# Patient Record
Sex: Female | Born: 1955 | Race: White | Hispanic: No | Marital: Married | Smoking: Never smoker
Health system: Southern US, Community
[De-identification: ages and names within clinical notes are randomized; demographics above are authoritative.]

## PROBLEM LIST (undated history)

## (undated) DIAGNOSIS — D649 Anemia, unspecified: Secondary | ICD-10-CM

## (undated) DIAGNOSIS — Z8719 Personal history of other diseases of the digestive system: Secondary | ICD-10-CM

## (undated) DIAGNOSIS — K219 Gastro-esophageal reflux disease without esophagitis: Secondary | ICD-10-CM

## (undated) DIAGNOSIS — T8859XA Other complications of anesthesia, initial encounter: Secondary | ICD-10-CM

## (undated) DIAGNOSIS — I480 Paroxysmal atrial fibrillation: Secondary | ICD-10-CM

## (undated) DIAGNOSIS — M419 Scoliosis, unspecified: Secondary | ICD-10-CM

## (undated) DIAGNOSIS — I10 Essential (primary) hypertension: Secondary | ICD-10-CM

## (undated) DIAGNOSIS — Z8489 Family history of other specified conditions: Secondary | ICD-10-CM

## (undated) DIAGNOSIS — Z87442 Personal history of urinary calculi: Secondary | ICD-10-CM

## (undated) DIAGNOSIS — R112 Nausea with vomiting, unspecified: Secondary | ICD-10-CM

## (undated) DIAGNOSIS — I421 Obstructive hypertrophic cardiomyopathy: Secondary | ICD-10-CM

## (undated) DIAGNOSIS — Z9889 Other specified postprocedural states: Secondary | ICD-10-CM

## (undated) HISTORY — PX: COLONOSCOPY: SHX174

## (undated) HISTORY — PX: LITHOTRIPSY: SUR834

---

## 1988-05-15 HISTORY — PX: DIAGNOSTIC LAPAROSCOPY: SUR761

## 2004-11-11 ENCOUNTER — Ambulatory Visit: Payer: Self-pay | Admitting: Gastroenterology

## 2004-12-09 ENCOUNTER — Ambulatory Visit: Payer: Self-pay | Admitting: Internal Medicine

## 2005-12-12 ENCOUNTER — Ambulatory Visit: Payer: Self-pay | Admitting: Internal Medicine

## 2009-09-16 ENCOUNTER — Ambulatory Visit: Payer: Self-pay | Admitting: Internal Medicine

## 2009-09-17 ENCOUNTER — Ambulatory Visit: Payer: Self-pay | Admitting: Internal Medicine

## 2009-09-23 ENCOUNTER — Ambulatory Visit: Payer: Self-pay | Admitting: Urology

## 2010-05-15 DIAGNOSIS — R42 Dizziness and giddiness: Secondary | ICD-10-CM

## 2010-05-15 HISTORY — DX: Dizziness and giddiness: R42

## 2011-09-12 ENCOUNTER — Ambulatory Visit: Payer: Self-pay | Admitting: Internal Medicine

## 2012-11-11 ENCOUNTER — Ambulatory Visit: Payer: Self-pay | Admitting: Internal Medicine

## 2013-11-12 ENCOUNTER — Ambulatory Visit: Payer: Self-pay | Admitting: Internal Medicine

## 2014-11-17 ENCOUNTER — Other Ambulatory Visit: Payer: Self-pay | Admitting: Internal Medicine

## 2014-11-17 DIAGNOSIS — Z1231 Encounter for screening mammogram for malignant neoplasm of breast: Secondary | ICD-10-CM

## 2014-11-18 ENCOUNTER — Ambulatory Visit
Admission: RE | Admit: 2014-11-18 | Discharge: 2014-11-18 | Disposition: A | Discharge status: Home / Self Care | Payer: BC Managed Care – PPO | Source: Ambulatory Visit | Attending: Internal Medicine | Admitting: Internal Medicine

## 2014-11-18 DIAGNOSIS — Z1231 Encounter for screening mammogram for malignant neoplasm of breast: Secondary | ICD-10-CM | POA: Diagnosis not present

## 2015-05-16 DIAGNOSIS — I5032 Chronic diastolic (congestive) heart failure: Secondary | ICD-10-CM | POA: Diagnosis present

## 2015-05-16 HISTORY — DX: Chronic diastolic (congestive) heart failure: I50.32

## 2015-08-18 ENCOUNTER — Emergency Department: Payer: BC Managed Care – PPO

## 2015-08-18 ENCOUNTER — Observation Stay
Admission: EM | Admit: 2015-08-18 | Discharge: 2015-08-19 | Disposition: A | Discharge status: Home / Self Care | Payer: BC Managed Care – PPO | Attending: Internal Medicine | Admitting: Internal Medicine

## 2015-08-18 ENCOUNTER — Observation Stay (HOSPITAL_BASED_OUTPATIENT_CLINIC_OR_DEPARTMENT_OTHER)
Admit: 2015-08-18 | Discharge: 2015-08-18 | Disposition: A | Discharge status: Home / Self Care | Payer: BC Managed Care – PPO | Attending: Internal Medicine | Admitting: Internal Medicine

## 2015-08-18 ENCOUNTER — Encounter: Payer: Self-pay | Admitting: Emergency Medicine

## 2015-08-18 DIAGNOSIS — I209 Angina pectoris, unspecified: Secondary | ICD-10-CM | POA: Diagnosis not present

## 2015-08-18 DIAGNOSIS — Z6834 Body mass index (BMI) 34.0-34.9, adult: Secondary | ICD-10-CM | POA: Diagnosis not present

## 2015-08-18 DIAGNOSIS — R079 Chest pain, unspecified: Secondary | ICD-10-CM

## 2015-08-18 DIAGNOSIS — I248 Other forms of acute ischemic heart disease: Secondary | ICD-10-CM | POA: Diagnosis not present

## 2015-08-18 DIAGNOSIS — M419 Scoliosis, unspecified: Secondary | ICD-10-CM | POA: Diagnosis not present

## 2015-08-18 DIAGNOSIS — R7989 Other specified abnormal findings of blood chemistry: Secondary | ICD-10-CM

## 2015-08-18 DIAGNOSIS — E669 Obesity, unspecified: Secondary | ICD-10-CM | POA: Diagnosis not present

## 2015-08-18 DIAGNOSIS — J329 Chronic sinusitis, unspecified: Secondary | ICD-10-CM | POA: Diagnosis not present

## 2015-08-18 DIAGNOSIS — Z8379 Family history of other diseases of the digestive system: Secondary | ICD-10-CM | POA: Diagnosis not present

## 2015-08-18 DIAGNOSIS — R778 Other specified abnormalities of plasma proteins: Secondary | ICD-10-CM

## 2015-08-18 DIAGNOSIS — I10 Essential (primary) hypertension: Secondary | ICD-10-CM | POA: Diagnosis not present

## 2015-08-18 DIAGNOSIS — Z833 Family history of diabetes mellitus: Secondary | ICD-10-CM | POA: Diagnosis not present

## 2015-08-18 DIAGNOSIS — I422 Other hypertrophic cardiomyopathy: Secondary | ICD-10-CM | POA: Diagnosis not present

## 2015-08-18 DIAGNOSIS — E785 Hyperlipidemia, unspecified: Secondary | ICD-10-CM | POA: Diagnosis not present

## 2015-08-18 DIAGNOSIS — I208 Other forms of angina pectoris: Secondary | ICD-10-CM

## 2015-08-18 DIAGNOSIS — R42 Dizziness and giddiness: Secondary | ICD-10-CM | POA: Insufficient documentation

## 2015-08-18 DIAGNOSIS — I2089 Other forms of angina pectoris: Secondary | ICD-10-CM

## 2015-08-18 HISTORY — DX: Essential (primary) hypertension: I10

## 2015-08-18 LAB — CBC
HEMATOCRIT: 39.9 % (ref 35.0–47.0)
Hemoglobin: 13.4 g/dL (ref 12.0–16.0)
MCH: 28.6 pg (ref 26.0–34.0)
MCHC: 33.7 g/dL (ref 32.0–36.0)
MCV: 84.8 fL (ref 80.0–100.0)
PLATELETS: 375 10*3/uL (ref 150–440)
RBC: 4.7 MIL/uL (ref 3.80–5.20)
RDW: 13.9 % (ref 11.5–14.5)
WBC: 11.4 10*3/uL — AB (ref 3.6–11.0)

## 2015-08-18 LAB — TROPONIN I
TROPONIN I: 0.05 ng/mL — AB (ref <0.031)
Troponin I: <0.03 ng/mL (ref <0.031)
Troponin I: <0.03 ng/mL (ref <0.031)

## 2015-08-18 LAB — LIPID PANEL
CHOL/HDL RATIO: 3.3 ratio
CHOLESTEROL: 226 mg/dL — AB (ref 0–200)
HDL: 69 mg/dL (ref >40)
LDL Cholesterol: 143 mg/dL — ABNORMAL HIGH (ref 0–99)
Triglycerides: 68 mg/dL (ref <150)
VLDL: 14 mg/dL (ref 0–40)

## 2015-08-18 LAB — BASIC METABOLIC PANEL
Anion gap: 7 (ref 5–15)
BUN: 19 mg/dL (ref 6–20)
CO2: 25 mmol/L (ref 22–32)
CREATININE: 0.66 mg/dL (ref 0.44–1.00)
Calcium: 9 mg/dL (ref 8.9–10.3)
Chloride: 106 mmol/L (ref 101–111)
Glucose, Bld: 109 mg/dL — ABNORMAL HIGH (ref 65–99)
POTASSIUM: 3.9 mmol/L (ref 3.5–5.1)
Sodium: 138 mmol/L (ref 135–145)

## 2015-08-18 LAB — ECHOCARDIOGRAM COMPLETE
Height: 68 in
WEIGHTICAEL: 3600 [oz_av]

## 2015-08-18 MED ORDER — NITROGLYCERIN 2 % TD OINT
1.0000 [in_us] | TOPICAL_OINTMENT | Freq: Once | TRANSDERMAL | Status: AC
Start: 2015-08-18 — End: 2015-08-18
  Administered 2015-08-18: 1 [in_us] via TOPICAL
  Filled 2015-08-18: qty 1

## 2015-08-18 MED ORDER — NITROGLYCERIN 0.4 MG SL SUBL: 0.4000 mg | SUBLINGUAL_TABLET | SUBLINGUAL | Status: DC | PRN

## 2015-08-18 MED ORDER — AMOXICILLIN 250 MG/5ML PO SUSR
875.0000 mg | Freq: Two times a day (BID) | ORAL | Status: DC
  Administered 2015-08-18 – 2015-08-19 (×2): 875 mg via ORAL
  Filled 2015-08-18 (×5): qty 20

## 2015-08-18 MED ORDER — PREDNISONE 10 MG PO TABS
5.0000 mg | ORAL_TABLET | ORAL | Status: DC
  Administered 2015-08-18: 5 mg via ORAL
  Filled 2015-08-18 (×2): qty 1

## 2015-08-18 MED ORDER — ACETAMINOPHEN 325 MG PO TABS
650.0000 mg | ORAL_TABLET | Freq: Once | ORAL | Status: AC
  Administered 2015-08-18: 650 mg via ORAL
  Filled 2015-08-18: qty 2

## 2015-08-18 MED ORDER — CALCIUM CARBONATE-VITAMIN D 500-200 MG-UNIT PO TABS
1.0000 | ORAL_TABLET | Freq: Every day | ORAL | Status: DC
  Administered 2015-08-19: 1 via ORAL
  Filled 2015-08-18: qty 1

## 2015-08-18 MED ORDER — AMOXICILLIN 875 MG PO TABS
875.0000 mg | ORAL_TABLET | Freq: Two times a day (BID) | ORAL | Status: DC
  Filled 2015-08-18 (×3): qty 1

## 2015-08-18 MED ORDER — HEPARIN SODIUM (PORCINE) 5000 UNIT/ML IJ SOLN
5000.0000 [IU] | Freq: Three times a day (TID) | INTRAMUSCULAR | Status: DC
  Administered 2015-08-18 – 2015-08-19 (×2): 5000 [IU] via SUBCUTANEOUS
  Filled 2015-08-18 (×2): qty 1

## 2015-08-18 MED ORDER — ASPIRIN 81 MG PO CHEW
324.0000 mg | CHEWABLE_TABLET | Freq: Once | ORAL | Status: DC
  Filled 2015-08-18: qty 4

## 2015-08-18 MED ORDER — ALENDRONATE SODIUM 70 MG PO TABS: 70.0000 mg | ORAL_TABLET | ORAL | Status: DC

## 2015-08-18 MED ORDER — ASPIRIN EC 325 MG PO TBEC
325.0000 mg | DELAYED_RELEASE_TABLET | Freq: Every day | ORAL | Status: DC
  Administered 2015-08-19: 325 mg via ORAL
  Filled 2015-08-18: qty 1

## 2015-08-18 MED ORDER — ENOXAPARIN SODIUM 100 MG/ML ~~LOC~~ SOLN
1.0000 mg/kg | Freq: Once | SUBCUTANEOUS | Status: AC
  Administered 2015-08-18: 100 mg via SUBCUTANEOUS
  Filled 2015-08-18: qty 1

## 2015-08-18 NOTE — Progress Notes (Signed)

## 2015-08-18 NOTE — H&P (Signed)
[facility] - [place] at [facility]   PATIENT NAME: Stephanie GrayerDeborah Helton    MR#:  [id number]  DATE OF BIRTH:  03/30/1956  DATE OF ADMISSION:  08/18/2015  PRIMARY CARE PHYSICIAN: Lynnea FerrierBERT J KLEIN III, MD   REQUESTING/REFERRING PHYSICIAN: Chrissie NoaWilliam  CHIEF COMPLAINT:   Chief Complaint  Patient presents with  . Chest Pain    "pressure to chest"    HISTORY OF PRESENT ILLNESS: Stephanie Cooper  is a 60 y.o. female with a known history of Hypertension- presents to emergency room today- after having pressure-like retrosternal chest pain for last night. The pain could not let her sleep overnight which was on and off and there were no aggravating or relieving factors. There was no associated palpitation or shortness of breath. Her husband checked her blood pressure and the systolic was more than 200.Marland Kitchen. She took 2 tablets of 325 mg aspirin at home and her husband drove her to emergency room. While she came to emergency room her pain started subsiding and it was completely gone since 7:00 in the morning. She denies any recent similar pain in the past.  For last 1 week she had complain of heaviness in her sinus and some ringing in the ER, so she saw her primary care doctor a few days ago and she was given amoxicillin tablet and prednisone which she is taking for last 3-4 days.  Her troponin was followed in ER and there was slight rise in the second troponin so she is given his admission for observation.  PAST MEDICAL HISTORY:   Past Medical History  Diagnosis Date  . Hypertension     PAST SURGICAL HISTORY: History reviewed. No pertinent past surgical history.  SOCIAL HISTORY:  Social History  Substance Use Topics  . Smoking status: Never Smoker   . Smokeless tobacco: Not on file  . Alcohol Use: No    FAMILY HISTORY:  Family History  Problem Relation Age of Onset  . Diverticulitis Mother   . Diabetes Father     DRUG ALLERGIES: No Known Allergies  REVIEW OF SYSTEMS:    CONSTITUTIONAL: No fever, fatigue or weakness.  EYES: No blurred or double vision.  EARS, NOSE, AND THROAT: No tinnitus or ear pain.  RESPIRATORY: No cough, shortness of breath, wheezing or hemoptysis.  CARDIOVASCULAR:positive chest pain, no orthopnea, edema.  GASTROINTESTINAL: No nausea, vomiting, diarrhea or abdominal pain.  GENITOURINARY: No dysuria, hematuria.  ENDOCRINE: No polyuria, nocturia,  HEMATOLOGY: No anemia, easy bruising or bleeding SKIN: No rash or lesion. MUSCULOSKELETAL: No joint pain or arthritis.   NEUROLOGIC: No tingling, numbness, weakness.  PSYCHIATRY: No anxiety or depression.   MEDICATIONS AT HOME:  Prior to Admission medications   Medication Sig Start Date End Date Taking? Authorizing Provider  alendronate (FOSAMAX) 70 MG tablet Take 70 mg by mouth once a week. Take with a full glass of water on an empty stomach.   Yes Historical Provider, MD  amoxicillin (AMOXIL) 875 MG tablet Take 875 mg by mouth 2 (two) times daily. 08/14/15 08/24/15 Yes Historical Provider, MD  calcium-vitamin D (OSCAL WITH D) 500-200 MG-UNIT tablet Take 1 tablet by mouth daily with breakfast.   Yes Historical Provider, MD  predniSONE (DELTASONE) 5 MG tablet Take 5 mg by mouth as directed. Taper dose 6-5-4-3-2-1 08/14/15 08/20/15 Yes Historical Provider, MD      PHYSICAL EXAMINATION:   VITAL SIGNS: Blood pressure 136/74, pulse 66, temperature 98.2 F (36.8 C), temperature source Oral, resp. rate 17, height 5\' 8"  (1.727 m),  weight 102.059 kg (225 lb), SpO2 99 %.  GENERAL:  60 y.o.-year-old patient lying in the bed with no acute distress.  EYES: Pupils equal, round, reactive to light and accommodation. No scleral icterus. Extraocular muscles intact.  HEENT: Head atraumatic, normocephalic. Oropharynx and nasopharynx clear.  NECK:  Supple, no jugular venous distention. No thyroid enlargement, no tenderness.  LUNGS: Normal breath sounds bilaterally, no wheezing, rales,rhonchi or crepitation. No  use of accessory muscles of respiration.  CARDIOVASCULAR: S1, S2 normal. No murmurs, rubs, or gallops.  ABDOMEN: Soft, nontender, nondistended. Bowel sounds present. No organomegaly or mass.  EXTREMITIES: No pedal edema, cyanosis, or clubbing.  NEUROLOGIC: Cranial nerves II through XII are intact. Muscle strength 5/5 in all extremities. Sensation intact. Gait not checked.  PSYCHIATRIC: The patient is alert and oriented x 3.  SKIN: No obvious rash, lesion, or ulcer.   LABORATORY PANEL:   CBC  Recent Labs Lab 08/18/15 0515  WBC 11.4*  HGB 13.4  HCT 39.9  PLT 375  MCV 84.8  MCH 28.6  MCHC 33.7  RDW 13.9   ------------------------------------------------------------------------------------------------------------------  Chemistries   Recent Labs Lab 08/18/15 0515  NA 138  K 3.9  CL 106  CO2 25  GLUCOSE 109*  BUN 19  CREATININE 0.66  CALCIUM 9.0   ------------------------------------------------------------------------------------------------------------------ estimated creatinine clearance is 91.2 mL/min (by C-G formula based on Cr of 0.66). ------------------------------------------------------------------------------------------------------------------ No results for input(s): TSH, T4TOTAL, T3FREE, THYROIDAB in the last 72 hours.  Invalid input(s): FREET3   Coagulation profile No results for input(s): INR, PROTIME in the last 168 hours. ------------------------------------------------------------------------------------------------------------------- No results for input(s): DDIMER in the last 72 hours. -------------------------------------------------------------------------------------------------------------------  Cardiac Enzymes  Recent Labs Lab 08/18/15 0515 08/18/15 0821  TROPONINI <0.03 0.05*   ------------------------------------------------------------------------------------------------------------------ Invalid input(s):  POCBNP  ---------------------------------------------------------------------------------------------------------------  Urinalysis No results found for: COLORURINE, APPEARANCEUR, LABSPEC, PHURINE, GLUCOSEU, HGBUR, BILIRUBINUR, KETONESUR, PROTEINUR, UROBILINOGEN, NITRITE, LEUKOCYTESUR   RADIOLOGY: Dg Chest 2 View  08/18/2015  CLINICAL DATA:  Chest pressure, onset yesterday. EXAM: CHEST  2 VIEW COMPARISON:  None. FINDINGS: The heart size is normal. Mild bronchitic change. Pulmonary vasculature is normal. No consolidation, pleural effusion, or pneumothorax. Moderate S-shaped scoliosis of the thoracic spine. IMPRESSION: Bronchitic change, no exams for comparison to evaluate for acuity. Scoliosis. Electronically Signed   By: Rubye Oaks M.D.   On: 08/18/2015 06:20    EKG:  NSR  IMPRESSION AND PLAN:  * Angina at rest   Monitor on tele, serial troponin.   Check lipid panel and hemoglobin A1c.   Cardiology consult and get echocardiogram.   Aspirin for now.  * Uncontrolled hypertension   She is not on any hypertensive medications at home   Her blood pressure was high when husband checked at home but here in emergency room it is under control.   I will continue monitoring currently without any medications.  * Sinusitis   She was given amoxicillin and oral prednisone by PMD, I will continue the same over here.  All the records are reviewed and case discussed with ED provider. Management plans discussed with the patient, family and they are in agreement.  CODE STATUS: Full code Code Status History    This patient does not have a recorded code status. Please follow your organizational policy for patients in this situation.      TOTAL TIME TAKING CARE OF THIS PATIENT: 50 minutes.    Altamese Dilling M.D on 08/18/2015   Between 7am to 6pm - Pager - [phone]  After 6pm go  to [email or web address] - password [email or web address]  [facility]  Office   [phone]  CC: Primary care physician; Lynnea Ferrier, MD   Note: This dictation was prepared with Dragon dictation along with smaller phrase technology. Any transcriptional errors that result from this process are unintentional.

## 2015-08-18 NOTE — ED Notes (Signed)
Patient has been complaining of headache. Nitropaste removed from chest (placed at 0930). Have placed page to admitting MD for tylenol order.

## 2015-08-18 NOTE — ED Provider Notes (Signed)
[facility] Emergency Department Provider Note     Time seen: ----------------------------------------- 8:51 AM on 08/18/2015 -----------------------------------------    I have reviewed the triage vital signs and the nursing notes.   HISTORY  Chief Complaint Chest Pain    HPI LASANDRA BATLEY is a 60 y.o. female who presents to ER for chest pressure since last night. She also has had ringing in her right ear since Monday. She denies any shortness of breath, nausea vomiting. Patient has not had a history of this, denies any risk factors for coronary artery disease. She states she has borderline hypertension, does not think she could be under significant stress. She does not exhibit reflux symptoms.   History reviewed. No pertinent past medical history.  There are no active problems to display for this patient.   History reviewed. No pertinent past surgical history.  Allergies Review of patient's allergies indicates no known allergies.  Social History Social History  Substance Use Topics  . Smoking status: Never Smoker   . Smokeless tobacco: None  . Alcohol Use: No    Review of Systems Constitutional: Negative for fever. Eyes: Negative for visual changes. ENT: Negative for sore throat.Positive for ringing in his ear Cardiovascular: Positive for chest pain Respiratory: Negative for shortness of breath. Gastrointestinal: Negative for abdominal pain, vomiting and diarrhea. Genitourinary: Negative for dysuria. Musculoskeletal: Negative for back pain. Skin: Negative for rash. Neurological: Negative for headaches, focal weakness or numbness.  10-point ROS otherwise negative.  ____________________________________________   PHYSICAL EXAM:  VITAL SIGNS: ED Triage Vitals  Enc Vitals Group     BP 08/18/15 0511 168/73 mmHg     Pulse Rate 08/18/15 0511 80     Resp 08/18/15 0511 20     Temp 08/18/15 0511 98.2 F (36.8 C)     Temp Source  08/18/15 0511 Oral     SpO2 08/18/15 0511 98 %     Weight 08/18/15 0511 225 lb (102.059 kg)     Height 08/18/15 0511  (1.727 m)     Head Cir --      Peak Flow --      Pain Score 08/18/15 0758 0     Pain Loc --      Pain Edu? --      Excl. in GC? --     Constitutional: Alert and oriented. Well appearing and in no distress. Eyes: Conjunctivae are normal. PERRL. Normal extraocular movements. ENT   Head: Normocephalic and atraumatic.   Nose: No congestion/rhinnorhea.   Mouth/Throat: Mucous membranes are moist.   Neck: No stridor. Cardiovascular: Normal rate, regular rhythm. Normal and symmetric distal pulses are present in all extremities. No murmurs, rubs, or gallops. Respiratory: Normal respiratory effort without tachypnea nor retractions. Breath sounds are clear and equal bilaterally. No wheezes/rales/rhonchi. Gastrointestinal: Soft and nontender. No distention. No abdominal bruits.  Musculoskeletal: Nontender with normal range of motion in all extremities. No joint effusions.  No lower extremity tenderness nor edema. Neurologic:  Normal speech and language. No gross focal neurologic deficits are appreciated. Speech is normal. No gait instability. Skin:  Skin is warm, dry and intact. No rash noted. Psychiatric: Mood and affect are normal. Speech and behavior are normal. Patient exhibits appropriate insight and judgment. ____________________________________________  EKG: Interpreted by me. Normal sinus rhythm rate of 72 bpm, normal PR interval, normal QRS, normal QT interval. Normal axis. Normal EKG.  ____________________________________________  ED COURSE:  Pertinent labs & imaging results that were available during my care  of the patient were reviewed by me and considered in my medical decision making (see chart for details). Patient is no acute distress, will check basic labs and likely repeat troponin. ____________________________________________    LABS  (pertinent positives/negatives)  Labs Reviewed  BASIC METABOLIC PANEL - Abnormal; Notable for the following:    Glucose, Bld 109 (*)    All other components within normal limits  CBC - Abnormal; Notable for the following:    WBC 11.4 (*)    All other components within normal limits  TROPONIN I - Abnormal; Notable for the following:    Troponin I 0.05 (*)    All other components within normal limits  TROPONIN I    RADIOLOGY  Chest x-ray IMPRESSION: Bronchitic change, no exams for comparison to evaluate for acuity.  Scoliosis. ____________________________________________  FINAL ASSESSMENT AND PLAN  Chest pain  Plan: Patient with labs and imaging as dictated above. Patient presented with nonspecific symptoms and borderline hypertension. Repeat troponin was elevated at 0.05. She'll be admitted for further evaluation and cardiology consultation.   Emily FilbertWilliams, Aliah Eriksson E, MD   Emily FilbertJonathan E Nixie Laube, MD 08/18/15 [phone]

## 2015-08-18 NOTE — ED Notes (Signed)
Pt reports taking amoxicillin for sinus infection and prednisone for right ear ringing

## 2015-08-18 NOTE — ED Notes (Signed)
Pt presents to the ER from home with complaints of pressure to her Chest since yesterday evening and ringing to right ear since Monday, denies any shortness of breath, denies any nausea or vomiting, pt talks in complete sentences no respiratory distress noted.

## 2015-08-19 LAB — BASIC METABOLIC PANEL
Anion gap: 3 — ABNORMAL LOW (ref 5–15)
BUN: 16 mg/dL (ref 6–20)
CALCIUM: 8.6 mg/dL — AB (ref 8.9–10.3)
CO2: 28 mmol/L (ref 22–32)
CREATININE: 0.65 mg/dL (ref 0.44–1.00)
Chloride: 108 mmol/L (ref 101–111)
Glucose, Bld: 98 mg/dL (ref 65–99)
Potassium: 3.9 mmol/L (ref 3.5–5.1)
SODIUM: 139 mmol/L (ref 135–145)

## 2015-08-19 LAB — CBC
HCT: 37.8 % (ref 35.0–47.0)
Hemoglobin: 12.8 g/dL (ref 12.0–16.0)
MCH: 29.2 pg (ref 26.0–34.0)
MCHC: 33.9 g/dL (ref 32.0–36.0)
MCV: 86.4 fL (ref 80.0–100.0)
PLATELETS: 337 10*3/uL (ref 150–440)
RBC: 4.38 MIL/uL (ref 3.80–5.20)
RDW: 13.8 % (ref 11.5–14.5)
WBC: 11 10*3/uL (ref 3.6–11.0)

## 2015-08-19 LAB — HEMOGLOBIN A1C: HEMOGLOBIN A1C: 5.9 % (ref 4.0–6.0)

## 2015-08-19 MED ORDER — ATORVASTATIN CALCIUM 40 MG PO TABS: 40.0000 mg | ORAL_TABLET | Freq: Every day | ORAL | Status: DC

## 2015-08-19 MED ORDER — ASPIRIN EC 81 MG PO TBEC: 81.0000 mg | DELAYED_RELEASE_TABLET | Freq: Every day | ORAL | Status: DC

## 2015-08-19 MED ORDER — LISINOPRIL 5 MG PO TABS: 5.0000 mg | ORAL_TABLET | Freq: Every day | ORAL | Status: DC

## 2015-08-19 MED ORDER — METOPROLOL SUCCINATE ER 25 MG PO TB24: 25.0000 mg | ORAL_TABLET | Freq: Every day | ORAL | Status: DC

## 2015-08-19 NOTE — Plan of Care (Signed)
Problem: Pain Managment: Goal: General experience of comfort will improve Outcome: Progressing Prn medications        

## 2015-08-19 NOTE — Discharge Summary (Signed)
[facility] - [place] at [facility]   PATIENT NAME: Stephanie Cooper    MR#:  [id number]  DATE OF BIRTH:  Jan 26, 1956  DATE OF ADMISSION:  08/18/2015 ADMITTING PHYSICIAN: Altamese Dilling, MD  DATE OF DISCHARGE: 08/19/2015  PRIMARY CARE PHYSICIAN: Curtis Sites III, MD    ADMISSION DIAGNOSIS:  Elevated troponin I level [R79.89] Chest pain, unspecified chest pain type [R07.9]  DISCHARGE DIAGNOSIS:  Principal Problem:   Angina at rest [facility])   Hypertrophic cardiomyopathy  SECONDARY DIAGNOSIS:   Past Medical History  Diagnosis Date  . Hypertension     HOSPITAL COURSE:   * Angina at rest  Monitor on tele, serial troponin negative.  Check lipid panel and hemoglobin A1c.  Cardiology consult done,    Echo showed Cardiomyopathy with relaxation abnormality- Diastolic heart failure.  Aspirin for now. Follow in cardiology office- may need stress test.  * Uncontrolled hypertension  She is not on any hypertensive medications at home  Start on metoprolol, Lisinopril.  * Hyperlipidemia   Start atorvastatin.  * Sinusitis  She was given amoxicillin and oral prednisone by PMD, I will continue the same over here.   DISCHARGE CONDITIONS:  Stable.  CONSULTS OBTAINED:  Treatment Team:  Alwyn Pea, MD  DRUG ALLERGIES:  No Known Allergies  DISCHARGE MEDICATIONS:   Current Discharge Medication List    START taking these medications   Details  aspirin EC 81 MG tablet Take 1 tablet (81 mg total) by mouth daily. Qty: 30 tablet, Refills: 0    atorvastatin (LIPITOR) 40 MG tablet Take 1 tablet (40 mg total) by mouth daily. Qty: 30 tablet, Refills: 0    lisinopril (PRINIVIL,ZESTRIL) 5 MG tablet Take 1 tablet (5 mg total) by mouth daily. Qty: 30 tablet, Refills: 0    metoprolol succinate (TOPROL XL) 25 MG 24 hr tablet Take 1 tablet (25 mg total) by mouth daily. Qty: 30 tablet, Refills: 0      CONTINUE these medications which have NOT  CHANGED   Details  alendronate (FOSAMAX) 70 MG tablet Take 70 mg by mouth once a week. Take with a full glass of water on an empty stomach.    amoxicillin (AMOXIL) 875 MG tablet Take 875 mg by mouth 2 (two) times daily. Refills: 0    calcium-vitamin D (OSCAL WITH D) 500-200 MG-UNIT tablet Take 1 tablet by mouth daily with breakfast.    predniSONE (DELTASONE) 5 MG tablet Take 5 mg by mouth as directed. Taper dose 6-5-4-3-2-1 Refills: 0         DISCHARGE INSTRUCTIONS:   Follow with cardiologist in 2 weeks.  If you experience worsening of your admission symptoms, develop shortness of breath, life threatening emergency, suicidal or homicidal thoughts you must seek medical attention immediately by calling 911 or calling your MD immediately  if symptoms less severe.  You Must read complete instructions/literature along with all the possible adverse reactions/side effects for all the Medicines you take and that have been prescribed to you. Take any new Medicines after you have completely understood and accept all the possible adverse reactions/side effects.   Please note  You were cared for by a hospitalist during your hospital stay. If you have any questions about your discharge medications or the care you received while you were in the hospital after you are discharged, you can call the unit and asked to speak with the hospitalist on call if the hospitalist that took care of you is not available. Once you  are discharged, your primary care physician will handle any further medical issues. Please note that NO REFILLS for any discharge medications will be authorized once you are discharged, as it is imperative that you return to your primary care physician (or establish a relationship with a primary care physician if you do not have one) for your aftercare needs so that they can reassess your need for medications and monitor your lab values.    Today   CHIEF COMPLAINT:   Chief Complaint   Patient presents with  . Chest Pain    "pressure to chest"    HISTORY OF PRESENT ILLNESS:  Stephanie Cooper  is a 60 y.o. female with a known history of Hypertension- presents to emergency room today- after having pressure-like retrosternal chest pain for last night. The pain could not let her sleep overnight which was on and off and there were no aggravating or relieving factors. There was no associated palpitation or shortness of breath. Her husband checked her blood pressure and the systolic was more than 200.Marland Kitchen She took 2 tablets of 325 mg aspirin at home and her husband drove her to emergency room. While she came to emergency room her pain started subsiding and it was completely gone since 7:00 in the morning. She denies any recent similar pain in the past.  For last 1 week she had complain of heaviness in her sinus and some ringing in the ER, so she saw her primary care doctor a few days ago and she was given amoxicillin tablet and prednisone which she is taking for last 3-4 days.  Her troponin was followed in ER and there was slight rise in the second troponin so she is given his admission for observation.   VITAL SIGNS:  Blood pressure 162/76, pulse 59, temperature 97.9 F (36.6 C), temperature source Oral, resp. rate 19, height  (1.727 m), weight 102.059 kg (225 lb), SpO2 100 %.  I/O:    Intake/Output Summary (Last 24 hours) at 08/19/15 1421 Last data filed at 08/19/15 1119  Gross per 24 hour  Intake    480 ml  Output    800 ml  Net   -320 ml    PHYSICAL EXAMINATION:   GENERAL: 60 y.o.-year-old patient lying in the bed with no acute distress.  EYES: Pupils equal, round, reactive to light and accommodation. No scleral icterus. Extraocular muscles intact.  HEENT: Head atraumatic, normocephalic. Oropharynx and nasopharynx clear.  NECK: Supple, no jugular venous distention. No thyroid enlargement, no tenderness.  LUNGS: Normal breath sounds bilaterally, no wheezing,  rales,rhonchi or crepitation. No use of accessory muscles of respiration.  CARDIOVASCULAR: S1, S2 normal. No murmurs, rubs, or gallops.  ABDOMEN: Soft, nontender, nondistended. Bowel sounds present. No organomegaly or mass.  EXTREMITIES: No pedal edema, cyanosis, or clubbing.  NEUROLOGIC: Cranial nerves II through XII are intact. Muscle strength 5/5 in all extremities. Sensation intact. Gait not checked.  PSYCHIATRIC: The patient is alert and oriented x 3.  SKIN: No obvious rash, lesion, or ulcer.   DATA REVIEW:   CBC  Recent Labs Lab 08/19/15 0502  WBC 11.0  HGB 12.8  HCT 37.8  PLT 337    Chemistries   Recent Labs Lab 08/19/15 0502  NA 139  K 3.9  CL 108  CO2 28  GLUCOSE 98  BUN 16  CREATININE 0.65  CALCIUM 8.6*    Cardiac Enzymes  Recent Labs Lab 08/18/15 1811  TROPONINI <0.03    Microbiology Results  No results found  for this or any previous visit.  RADIOLOGY:  Dg Chest 2 View  08/18/2015  CLINICAL DATA:  Chest pressure, onset yesterday. EXAM: CHEST  2 VIEW COMPARISON:  None. FINDINGS: The heart size is normal. Mild bronchitic change. Pulmonary vasculature is normal. No consolidation, pleural effusion, or pneumothorax. Moderate S-shaped scoliosis of the thoracic spine. IMPRESSION: Bronchitic change, no exams for comparison to evaluate for acuity. Scoliosis. Electronically Signed   By: Rubye OaksMelanie  Ehinger M.D.   On: 08/18/2015 06:20    EKG:   Orders placed or performed during the hospital encounter of 08/18/15  . EKG 12-Lead  . EKG 12-Lead  . ED EKG within 10 minutes  . ED EKG within 10 minutes      Management plans discussed with the patient, family and they are in agreement.  CODE STATUS:     Code Status Orders        Start     Ordered   08/18/15 1056  Full code   Continuous     08/18/15 1055    Code Status History    Date Active Date Inactive Code Status Order ID Comments User Context   This patient has a current code status but no  historical code status.      TOTAL TIME TAKING CARE OF THIS PATIENT: 35 minutes.    Altamese DillingVACHHANI, Eily Louvier M.D on 08/19/2015 at 2:21 PM  Between 7am to 6pm - Pager - [phone]  After 6pm go to [email or web address] - password [email or web address]  [facility] [place] [facility]  Office  [phone]  CC: Primary care physician; Lynnea FerrierBERT J KLEIN III, MD   Note: This dictation was prepared with Dragon dictation along with smaller phrase technology. Any transcriptional errors that result from this process are unintentional.

## 2015-08-19 NOTE — Consult Note (Signed)
Reason for Consult: Chest pain borderline troponin hypertensive urgency Referring Physician: Dr. Caryl Comes primary, Dr. Anselm Jungling hospitalist  Stephanie Cooper is an 60 y.o. female.  HPI: Patient 60 year old female hypertension didn't feel well at home with chest pressure has had sinus trouble has been on amoxicillin and prednisone she began to have some chest pain midsternal off and on throughout the night had her husband take her blood pressure systolic was 245 she was given aspirin by her husband and brought to the emergency room for evaluation. Patient was described chest heaviness over the last week ringing in her ears from presumed sinus trouble still taking antibiotics and steroids. In emergency room was found to have borderline troponin so was admitted for further evaluation  Past Medical History  Diagnosis Date  . Hypertension     History reviewed. No pertinent past surgical history.  Family History  Problem Relation Age of Onset  . Diverticulitis Mother   . Diabetes Father     Social History:  reports that she has never smoked. She does not have any smokeless tobacco history on file. She reports that she does not drink alcohol. Her drug history is not on file.  Allergies: No Known Allergies  Medications: I have reviewed the patient's current medications.  Results for orders placed or performed during the hospital encounter of 08/18/15 (from the past 48 hour(s))  Basic metabolic panel     Status: Abnormal   Collection Time: 08/18/15  5:15 AM  Result Value Ref Range   Sodium 138 135 - 145 mmol/L   Potassium 3.9 3.5 - 5.1 mmol/L   Chloride 106 101 - 111 mmol/L   CO2 25 22 - 32 mmol/L   Glucose, Bld 109 (H) 65 - 99 mg/dL   BUN 19 6 - 20 mg/dL   Creatinine, Ser 0.66 0.44 - 1.00 mg/dL   Calcium 9.0 8.9 - 10.3 mg/dL   GFR calc non Af Amer >60 >60 mL/min   GFR calc Af Amer >60 >60 mL/min    Comment: (NOTE) The eGFR has been calculated using the CKD EPI equation. This  calculation has not been validated in all clinical situations. eGFR's persistently <60 mL/min signify possible Chronic Kidney Disease.    Anion gap 7 5 - 15  CBC     Status: Abnormal   Collection Time: 08/18/15  5:15 AM  Result Value Ref Range   WBC 11.4 (H) 3.6 - 11.0 K/uL   RBC 4.70 3.80 - 5.20 MIL/uL   Hemoglobin 13.4 12.0 - 16.0 g/dL   HCT 39.9 35.0 - 47.0 %   MCV 84.8 80.0 - 100.0 fL   MCH 28.6 26.0 - 34.0 pg   MCHC 33.7 32.0 - 36.0 g/dL   RDW 13.9 11.5 - 14.5 %   Platelets 375 150 - 440 K/uL  Troponin I     Status: None   Collection Time: 08/18/15  5:15 AM  Result Value Ref Range   Troponin I <0.03 <0.031 ng/mL    Comment:        NO INDICATION OF MYOCARDIAL INJURY.   Troponin I     Status: Abnormal   Collection Time: 08/18/15  8:21 AM  Result Value Ref Range   Troponin I 0.05 (H) <0.031 ng/mL    Comment: READ BACK AND VERIFIED WITH ANNA HOLT '@0852'  ON 08/18/15 BY [facility]        PERSISTENTLY INCREASED TROPONIN VALUES IN THE RANGE OF 0.04-0.49 ng/mL CAN BE SEEN IN:       -  UNSTABLE ANGINA       -CONGESTIVE HEART FAILURE       -MYOCARDITIS       -CHEST TRAUMA       -ARRYHTHMIAS       -LATE PRESENTING MYOCARDIAL INFARCTION       -COPD   CLINICAL FOLLOW-UP RECOMMENDED.   Hemoglobin A1c     Status: None   Collection Time: 08/18/15  8:21 AM  Result Value Ref Range   Hgb A1c MFr Bld 5.9 4.0 - 6.0 %  Lipid panel     Status: Abnormal   Collection Time: 08/18/15  8:21 AM  Result Value Ref Range   Cholesterol 226 (H) 0 - 200 mg/dL   Triglycerides 68 <150 mg/dL   HDL 69 >40 mg/dL   Total CHOL/HDL Ratio 3.3 RATIO   VLDL 14 0 - 40 mg/dL   LDL Cholesterol 143 (H) 0 - 99 mg/dL    Comment:        Total Cholesterol/HDL:CHD Risk Coronary Heart Disease Risk Table                     Men   Women  1/2 Average Risk   3.4   3.3  Average Risk       5.0   4.4  2 X Average Risk   9.6   7.1  3 X Average Risk  23.4   11.0        Use the calculated Patient Ratio above and the CHD  Risk Table to determine the patient's CHD Risk.        ATP III CLASSIFICATION (LDL):  <100     mg/dL   Optimal  100-129  mg/dL   Near or Above                    Optimal  130-159  mg/dL   Borderline  160-189  mg/dL   High  >190     mg/dL   Very High   Troponin I     Status: None   Collection Time: 08/18/15  1:05 PM  Result Value Ref Range   Troponin I <0.03 <0.031 ng/mL    Comment:        NO INDICATION OF MYOCARDIAL INJURY.   Troponin I     Status: None   Collection Time: 08/18/15  6:11 PM  Result Value Ref Range   Troponin I <0.03 <0.031 ng/mL    Comment:        NO INDICATION OF MYOCARDIAL INJURY.   Basic metabolic panel     Status: Abnormal   Collection Time: 08/19/15  5:02 AM  Result Value Ref Range   Sodium 139 135 - 145 mmol/L   Potassium 3.9 3.5 - 5.1 mmol/L   Chloride 108 101 - 111 mmol/L   CO2 28 22 - 32 mmol/L   Glucose, Bld 98 65 - 99 mg/dL   BUN 16 6 - 20 mg/dL   Creatinine, Ser 0.65 0.44 - 1.00 mg/dL   Calcium 8.6 (L) 8.9 - 10.3 mg/dL   GFR calc non Af Amer >60 >60 mL/min   GFR calc Af Amer >60 >60 mL/min    Comment: (NOTE) The eGFR has been calculated using the CKD EPI equation. This calculation has not been validated in all clinical situations. eGFR's persistently <60 mL/min signify possible Chronic Kidney Disease.    Anion gap 3 (L) 5 - 15  CBC     Status: None  Collection Time: 08/19/15  5:02 AM  Result Value Ref Range   WBC 11.0 3.6 - 11.0 K/uL   RBC 4.38 3.80 - 5.20 MIL/uL   Hemoglobin 12.8 12.0 - 16.0 g/dL   HCT 37.8 35.0 - 47.0 %   MCV 86.4 80.0 - 100.0 fL   MCH 29.2 26.0 - 34.0 pg   MCHC 33.9 32.0 - 36.0 g/dL   RDW 13.8 11.5 - 14.5 %   Platelets 337 150 - 440 K/uL    Dg Chest 2 View  08/18/2015  CLINICAL DATA:  Chest pressure, onset yesterday. EXAM: CHEST  2 VIEW COMPARISON:  None. FINDINGS: The heart size is normal. Mild bronchitic change. Pulmonary vasculature is normal. No consolidation, pleural effusion, or pneumothorax. Moderate  S-shaped scoliosis of the thoracic spine. IMPRESSION: Bronchitic change, no exams for comparison to evaluate for acuity. Scoliosis. Electronically Signed   By: Jeb Levering M.D.   On: 08/18/2015 06:20    Review of Systems  Constitutional: Negative.   HENT: Negative.   Eyes: Negative.   Respiratory: Negative.   Cardiovascular: Positive for chest pain.  Gastrointestinal: Negative.   Genitourinary: Negative.   Musculoskeletal: Negative.   Skin: Negative.   Neurological: Negative.   Endo/Heme/Allergies: Negative.   Psychiatric/Behavioral: Negative.    Blood pressure 162/76, pulse 59, temperature 97.9 F (36.6 C), temperature source Oral, resp. rate 19, height '5\' 8"'  (1.727 m), weight 102.059 kg (225 lb), SpO2 100 %. Physical Exam  Assessment/Plan: Angina Hypertension Sinusitis Obesity Vertigo Borderline troponins/ demand ischemia . PLAN Agree with ROMI Recommend ASA 99m QD Start b-blocker for angina and Bp ACE for HTN lisinopril 5 mg qd Rec outpt myoview for CP Agree with ECHO for angina HTN Possible demand ischemia recommend conservative medical therapy aspirin low-dose beta blocker ACE inhibitor F/U with cardiology as outpt    Elka Satterfield D. 08/19/2015, 2:21 PM

## 2015-08-19 NOTE — Care Management (Signed)
CM assessment for discharge planning. Met with with patient at bedside. She is alert, oriented, independent and active. She drives. Lives at home with her spouse. PCP is Dr. Ramonita Lab. Denies issues accessing medical care, copays or transportation.

## 2015-08-19 NOTE — Progress Notes (Signed)
Pt discharged to home via wc.  Instructions and rx given to pt.  Questions answered.  No distress.  

## 2015-08-19 NOTE — Discharge Instructions (Signed)
Low salt diet

## 2015-12-09 ENCOUNTER — Other Ambulatory Visit: Payer: Self-pay | Admitting: Internal Medicine

## 2015-12-09 DIAGNOSIS — Z1231 Encounter for screening mammogram for malignant neoplasm of breast: Secondary | ICD-10-CM

## 2015-12-23 ENCOUNTER — Ambulatory Visit
Admission: RE | Admit: 2015-12-23 | Discharge: 2015-12-23 | Disposition: A | Discharge status: Home / Self Care | Payer: BC Managed Care – PPO | Source: Ambulatory Visit | Attending: Internal Medicine | Admitting: Internal Medicine

## 2015-12-23 ENCOUNTER — Other Ambulatory Visit: Payer: Self-pay | Admitting: Internal Medicine

## 2015-12-23 DIAGNOSIS — Z1231 Encounter for screening mammogram for malignant neoplasm of breast: Secondary | ICD-10-CM | POA: Diagnosis present

## 2016-04-05 ENCOUNTER — Other Ambulatory Visit: Payer: Self-pay | Admitting: Otolaryngology

## 2016-04-05 DIAGNOSIS — H905 Unspecified sensorineural hearing loss: Secondary | ICD-10-CM

## 2016-04-05 DIAGNOSIS — H9311 Tinnitus, right ear: Secondary | ICD-10-CM

## 2016-04-18 ENCOUNTER — Ambulatory Visit
Admission: RE | Admit: 2016-04-18 | Discharge: 2016-04-18 | Disposition: A | Discharge status: Home / Self Care | Payer: BC Managed Care – PPO | Source: Ambulatory Visit | Attending: Otolaryngology | Admitting: Otolaryngology

## 2016-04-18 DIAGNOSIS — H905 Unspecified sensorineural hearing loss: Secondary | ICD-10-CM | POA: Diagnosis not present

## 2016-04-18 DIAGNOSIS — H9311 Tinnitus, right ear: Secondary | ICD-10-CM | POA: Insufficient documentation

## 2016-04-18 LAB — POCT I-STAT CREATININE: Creatinine, Ser: 0.7 mg/dL (ref 0.44–1.00)

## 2016-04-18 MED ORDER — GADOBENATE DIMEGLUMINE 529 MG/ML IV SOLN
20.0000 mL | Freq: Once | INTRAVENOUS | Status: AC | PRN
  Administered 2016-04-18: 20 mL via INTRAVENOUS

## 2017-05-09 ENCOUNTER — Other Ambulatory Visit: Payer: Self-pay | Admitting: Internal Medicine

## 2017-05-09 DIAGNOSIS — Z1231 Encounter for screening mammogram for malignant neoplasm of breast: Secondary | ICD-10-CM

## 2017-06-11 ENCOUNTER — Ambulatory Visit
Admission: RE | Admit: 2017-06-11 | Discharge: 2017-06-11 | Disposition: A | Discharge status: Home / Self Care | Payer: BC Managed Care – PPO | Source: Ambulatory Visit | Attending: Internal Medicine | Admitting: Internal Medicine

## 2017-06-11 DIAGNOSIS — Z1231 Encounter for screening mammogram for malignant neoplasm of breast: Secondary | ICD-10-CM | POA: Insufficient documentation

## 2017-09-03 ENCOUNTER — Ambulatory Visit
Admission: RE | Admit: 2017-09-03 | Payer: BC Managed Care – PPO | Source: Ambulatory Visit | Admitting: Unknown Physician Specialty

## 2017-09-03 ENCOUNTER — Encounter: Admission: RE | Payer: Self-pay | Source: Ambulatory Visit

## 2017-09-03 SURGERY — COLONOSCOPY WITH PROPOFOL
Anesthesia: General

## 2019-06-28 ENCOUNTER — Ambulatory Visit: Payer: BC Managed Care – PPO | Attending: Internal Medicine

## 2019-06-28 DIAGNOSIS — Z23 Encounter for immunization: Secondary | ICD-10-CM | POA: Insufficient documentation

## 2019-06-28 NOTE — Progress Notes (Signed)
   Covid-19 Vaccination Clinic  Name:  Stephanie Cooper    MRN: [id number] DOB: [birth year]  06/28/2019  [name] was observed post Covid-19 immunization for 15 minutes without incidence. She was provided with Vaccine Information Sheet and instruction to access the V-Safe system.   [name] was instructed to call 911 with any severe reactions post vaccine: Marland Kitchen Difficulty breathing  . Swelling of your face and throat  . A fast heartbeat  . A bad rash all over your body  . Dizziness and weakness    Immunizations Administered    Name Date Dose VIS Date Route   Pfizer COVID-19 Vaccine 06/28/2019  9:23 AM 0.3 mL 04/25/2019 Intramuscular   Manufacturer: [employer], [employer]   Lot: [id number]   NDC: [address]-8

## 2019-07-19 ENCOUNTER — Ambulatory Visit: Payer: BC Managed Care – PPO | Attending: Internal Medicine

## 2019-07-19 ENCOUNTER — Other Ambulatory Visit: Payer: Self-pay

## 2019-07-19 DIAGNOSIS — Z23 Encounter for immunization: Secondary | ICD-10-CM

## 2019-07-19 NOTE — Progress Notes (Signed)
   Covid-19 Vaccination Clinic  Name:  NURIYAH HANLINE    MRN: [id number] DOB: 1955-07-31  07/19/2019  Ms. Mclamb was observed post Covid-19 immunization for 15 minutes without incident. She was provided with Vaccine Information Sheet and instruction to access the V-Safe system.   Ms. Mcjunkins was instructed to call 911 with any severe reactions post vaccine: Marland Kitchen Difficulty breathing  . Swelling of face and throat  . A fast heartbeat  . A bad rash all over body  . Dizziness and weakness   Immunizations Administered    Name Date Dose VIS Date Route   Pfizer COVID-19 Vaccine 07/19/2019  9:18 AM 0.3 mL 04/25/2019 Intramuscular   Manufacturer: [employer], [employer]   Lot: [id number]   NDC: [address]-8

## 2019-10-31 ENCOUNTER — Other Ambulatory Visit: Payer: Self-pay | Admitting: Internal Medicine

## 2019-10-31 DIAGNOSIS — Z1231 Encounter for screening mammogram for malignant neoplasm of breast: Secondary | ICD-10-CM

## 2019-11-06 ENCOUNTER — Ambulatory Visit
Admission: RE | Admit: 2019-11-06 | Discharge: 2019-11-06 | Disposition: A | Discharge status: Home / Self Care | Payer: BC Managed Care – PPO | Source: Ambulatory Visit | Attending: Internal Medicine | Admitting: Internal Medicine

## 2019-11-06 DIAGNOSIS — Z1231 Encounter for screening mammogram for malignant neoplasm of breast: Secondary | ICD-10-CM

## 2019-11-20 ENCOUNTER — Other Ambulatory Visit: Payer: Self-pay | Admitting: Obstetrics and Gynecology

## 2019-11-24 NOTE — H&P (Signed)
Stephanie Cooper is a 64 y.o. female here for Discuss sono .here for follow up for PMB . Inability to sample the lining of the uterus .   U/s 11/12/19: Ut wnl  Endometrium=20.01 mm  Neither ov seen  No adnexal masses seen  Past Medical History:  has a past medical history of Allergic rhinitis, Borderline hypertension, Chronic diastolic CHF (congestive heart failure) (CMS-HCC) (09/02/2015), History of lower GI bleeding (10/2002), Hyperlipidemia, Nephrolithiasis, Osteoporosis, post-menopausal, and Scoliosis.  Past Surgical History:  has a past surgical history that includes Lithotripsy (09/23/09); Ovarian wedge resection (1980s); Colonoscopy (02/27/2012); and Colonoscopy (11/11/2004, 10/21/2002). Family History: family history includes High blood pressure (Hypertension) in her father; Pulmonary embolism in her mother. Social History:  reports that she has never smoked. She has never used smokeless tobacco. She reports current alcohol use. She reports that she does not use drugs. OB/GYN History:          OB History    Gravida  3   Para  3   Term  3   Preterm      AB      Living  3     SAB      TAB      Ectopic      Molar      Multiple      Live Births  3          Allergies: is allergic to lisinopril and prednisone. Medications:  Current Outpatient Medications:  .  alendronate (FOSAMAX) 70 MG tablet, Take 1 tablet (70 mg total) by mouth every 7 (seven) days Take with a full glass of water. Do not lie down for the next 30 min., Disp: 13 tablet, Rfl: 4 .  CALCIUM ORAL, Take by mouth One daily, Disp: , Rfl:  .  ergocalciferol, vitamin D2, (VITAMIN D3 ORAL), Take 600 Units by mouth once daily, Disp: , Rfl:  .  famotidine (PEPCID) 20 MG tablet, Take 1 tablet (20 mg total) by mouth 2 (two) times daily, Disp: 60 tablet, Rfl: 11 .  ferrous fumarate-vitamin C (VITRON-C) 65 mg iron- 125 mg tablet, Take 1 tablet by mouth once daily, Disp: , Rfl:  .  fluticasone (FLONASE) 50  mcg/actuation nasal spray, Place 2 sprays into both nostrils once daily, Disp: , Rfl:  .  losartan (COZAAR) 25 MG tablet, Take 1 tablet (25 mg total) by mouth once daily, Disp: 90 tablet, Rfl: 1 .  montelukast (SINGULAIR) 10 mg tablet, Take 1 tablet (10 mg total) by mouth nightly, Disp: 90 tablet, Rfl: 3  Review of Systems: General:                      No fatigue or weight loss Eyes:                           No vision changes Ears:                            No hearing difficulty Respiratory:                No cough or shortness of breath Pulmonary:                  No asthma or shortness of breath Cardiovascular:           No chest pain, palpitations, dyspnea on exertion Gastrointestinal:  No abdominal bloating, chronic diarrhea, constipations, masses, pain or hematochezia Genitourinary:             No hematuria, dysuria, abnormal vaginal discharge, pelvic pain, Menometrorrhagia Lymphatic:                   No swollen lymph nodes Musculoskeletal:         No muscle weakness Neurologic:                  No extremity weakness, syncope, seizure disorder Psychiatric:                  No history of depression, delusions or suicidal/homicidal ideation    Exam:      Vitals:   11/12/19 1507  BP: 133/81  Pulse: 79    Body mass index is 35.02 kg/m.  WDWN white/ black female in NAD   Lungs: CTA  CV : RRR without murmur   Breast: exam done in sitting and lying position : No dimpling or retraction, no dominant mass, no spontaneous discharge, no axillary adenopathy Neck:  no thyromegaly Abdomen: soft , no mass, normal active bowel sounds,  non-tender, no rebound tenderness Pelvic: tanner stage 5 ,  External genitalia: vulva /labia no lesions Urethra: no prolapse Vagina: normal physiologic d/c Cervix: no lesions, no cervical motion tenderness   Uterus: normal size shape and contour, non-tender Adnexa: no mass,  non-tender   Rectovaginal: no mass heme  negative  Impression:   The primary encounter diagnosis was Thickened endometrium. A diagnosis of PMB (postmenopausal bleeding) was also pertinent to this visit.    Plan:   I have explained the findings of the u/s and the ramifications of a thickened endometrial stripe .  I have recommended a Fractional D+C and hysteroscopy and possible myosure  Benefits and risks to surgery: The proposed benefit of the surgery has been discussed with the patient. The possible risks include, but are not limited to: organ injury to the bowel , bladder, ureters, and major blood vessels and nerves. There is a possibility of additional surgeries resulting from these injuries. There is also the risk of blood transfusion and the need to receive blood products during or after the procedure which may rarely lead to HIV or Hepatitis C infection. There is a risk of developing a deep venous thrombosis or a pulmonary embolism . There is the possibility of wound infection and also anesthetic complications, even the rare possibility of death. The patient understands these risks and wishes to proceed. All questions have been answered    Vilma Prader, MD

## 2019-11-28 ENCOUNTER — Encounter
Admission: RE | Admit: 2019-11-28 | Discharge: 2019-11-28 | Disposition: A | Discharge status: Home / Self Care | Payer: BC Managed Care – PPO | Source: Ambulatory Visit | Attending: Obstetrics and Gynecology | Admitting: Obstetrics and Gynecology

## 2019-11-28 ENCOUNTER — Other Ambulatory Visit: Payer: Self-pay

## 2019-11-28 HISTORY — DX: Scoliosis, unspecified: M41.9

## 2019-11-28 HISTORY — DX: Nausea with vomiting, unspecified: R11.2

## 2019-11-28 HISTORY — DX: Family history of other specified conditions: Z84.89

## 2019-11-28 HISTORY — DX: Other complications of anesthesia, initial encounter: T88.59XA

## 2019-11-28 HISTORY — DX: Gastro-esophageal reflux disease without esophagitis: K21.9

## 2019-11-28 HISTORY — DX: Other specified postprocedural states: Z98.890

## 2019-11-28 HISTORY — DX: Anemia, unspecified: D64.9

## 2019-11-28 HISTORY — DX: Personal history of urinary calculi: Z87.442

## 2019-11-28 NOTE — Patient Instructions (Addendum)
Your procedure is scheduled on: 12-05-19 FRIDAY Report to Same Day Surgery 2nd floor medical mall [facility] Entrance-take elevator on left to 2nd floor.  Check in with surgery information desk.) To find out your arrival time please call [phone] between 1PM - 3PM on 12-04-19 THURSDAY  Remember: Instructions that are not followed completely may result in serious medical risk, up to and including death, or upon the discretion of your surgeon and anesthesiologist your surgery may need to be rescheduled.    _x___ 1. Do not eat food after midnight the night before your procedure. NO GUM OR CANDY AFTER MIDNIGHT. You may drink clear liquids up to 2 hours before you are scheduled to arrive at the hospital for your procedure.  Do not drink clear liquids within 2 hours of your scheduled arrival to the hospital.  Clear liquids include  --Water or Apple juice without pulp  --Gatorade  --Black Coffee or Clear Tea (No milk, no creamers, do not add anything to the coffee or Tea-ok to add sugar)  _x___Ensure clear carbohydrate drink-FINISH DRINK 2 HOURS PRIOR TO ARRIVAL TIME TO HOSPITAL THE DAY OF YOUR SURGERY     __x__ 2. No Alcohol for 24 hours before or after surgery.   __x__3. No Smoking or e-cigarettes for 24 prior to surgery.  Do not use any chewable tobacco products for at least 6 hour prior to surgery   ____  4. Bring all medications with you on the day of surgery if instructed.    __x__ 5. Notify your doctor if there is any change in your medical condition     (cold, fever, infections).    x___6. On the morning of surgery brush your teeth with toothpaste and water.  You may rinse your mouth with mouth wash if you wish.  Do not swallow any toothpaste or mouthwash.   Do not wear jewelry, make-up, hairpins, clips or nail polish.  Do not wear lotions, powders, or perfumes. You may wear deodorant.  Do not shave 48 hours prior to surgery. Men may shave face and neck.  Do not bring  valuables to the hospital.    [facility] is not responsible for any belongings or valuables.               Contacts, dentures or bridgework may not be worn into surgery.  Leave your suitcase in the car. After surgery it may be brought to your room.  For patients admitted to the hospital, discharge time is determined by your treatment team.  _  Patients discharged the day of surgery will not be allowed to drive home.  You will need someone to drive you home and stay with you the night of your procedure.    Please read over the following fact sheets that you were given:   [facility] Preparing for Surgery/INCENTIVE SPIROMETER INSTRUCTIONS  _x___ TAKE THE FOLLOWING MEDICATION THE MORNING OF SURGERY WITH A SMALL SIP OF WATER. These include:  1. PEPCID (FAMOTIDINE)  2.  3.  4.  5.  6.  ____Fleets enema or Magnesium Citrate as directed.   ____ Use CHG Soap or sage wipes as directed on instruction sheet   ____ Use inhalers on the day of surgery and bring to hospital day of surgery  ____ Stop Metformin and Janumet 2 days prior to surgery.    ____ Take 1/2 of usual insulin dose the night before surgery and none on the morning surgery.   ____ Follow recommendations from Cardiologist, Pulmonologist  or PCP regarding stopping Aspirin, Coumadin, Plavix ,Eliquis, Effient, or Pradaxa, and Pletal.  X____Stop Anti-inflammatories such as Advil, Aleve, Ibuprofen, Motrin, Naproxen, Naprosyn, Goodies powders or aspirin products NOW-OK to take Tylenol    _x___ Stop supplements until after surgery-STOP YOUR VITRON C NOW-YOU MAY RESUME AFTER YOUR SURGERY   ____ Bring C-Pap to the hospital.

## 2019-11-28 NOTE — Pre-Procedure Instructions (Signed)
Progress Notes - documented in this encounter Stephanie Capuchin, MD - 07/02/2018 4:15 PM EST Formatting of this note is different from the original. Established Patient Visit   Chief Complaint: Chief Complaint  Patient presents with   Follow-up  RECHECK PER DR Graciela Husbands   Follow-up Studies  ECHOCARDIOGRAM, LEAKY VALVE?  Date of Service: 07/02/2018 Date of Birth: 12/05/1955 PCP: Sallee Provencal, MD  History of Present Illness: Stephanie Cooper is a 64 y.o.female patient who patient had episodes of vague shortness of breath was referred by primary physician for further evaluation patient is been seen previously and had a cardiac work-up back in 2017 which was essentially unremarkable. Patient reported echocardiogram with suggested evidence of mild tricuspid and mitral regurgitation patient is also asymptomatic now here for routine follow-up after being referred. Patient denies any significant chest pain no lightheadedness dizziness the palpitations or tachycardia  Past Medical and Surgical History  Past Medical History Past Medical History:  Diagnosis Date   Allergic rhinitis   Borderline hypertension  Diet controlled.   Chronic diastolic CHF (congestive heart failure) (CMS-HCC) 09/02/2015  Grade 2 diastolic dysfunction by echocardiogram 4/17. LVEF > 60%. Myoview 4/17 was negative for ischemia   History of lower GI bleeding 10/2002   Hyperlipidemia   Nephrolithiasis   Osteoporosis, post-menopausal   Scoliosis   Past Surgical History She has a past surgical history that includes Lithotripsy (09/23/09); Ovarian wedge resection (1980s); Colonoscopy (02/27/2012); and Colonoscopy (11/11/2004, 10/21/2002).   Medications and Allergies  Current Medications  Current Outpatient Medications  Medication Sig Dispense Refill   alendronate (FOSAMAX) 70 MG tablet Take 1 tablet (70 mg total) by mouth every 7 (seven) days Take with a full glass of water. Do not lie down for the next 30 min.  13 tablet 4   CALCIUM ORAL Take by mouth One daily   ergocalciferol, vitamin D2, (VITAMIN D3 ORAL) Take 600 Units by mouth once daily   fluticasone (FLONASE) 50 mcg/actuation nasal spray Place 2 sprays into both nostrils once daily   losartan (COZAAR) 25 MG tablet Take 1 tablet (25 mg total) by mouth once daily 90 tablet 1   montelukast (SINGULAIR) 10 mg tablet Take 1 tablet (10 mg total) by mouth nightly 90 tablet 3   No current facility-administered medications for this visit.   Allergies: Lisinopril and Prednisone  Social and Family History  Social History reports that she has never smoked. She has never used smokeless tobacco. She reports current alcohol use. She reports that she does not use drugs.  Family History Family History  Problem Relation Age of Onset   Pulmonary embolism Mother  s/p GI hemorrhage   High blood pressure (Hypertension) Father   Review of Systems   Review of Systems: The patient denies chest pain, shortness of breath, orthopnea, paroxysmal nocturnal dyspnea, pedal edema, palpitations, heart racing, presyncope, syncope. Review of 12 Systems is negative except as described above.  Physical Examination   Vitals:BP 126/70   Pulse 101   Ht 175.3 cm (5\' 9" )   Wt 97 kg (213 lb 13.5 oz)   SpO2 97%   BMI 31.58 kg/m  Ht:175.3 cm (5\' 9" ) Wt:97 kg (213 lb 13.5 oz) surface area is 2.17 meters squared. Body mass index is 31.58 kg/m.  HEENT: Pupils equally reactive to light and accomodation  Neck: Supple without thyromegaly, carotid pulses 2+ Lungs: clear to auscultation bilaterally; no wheezes, rales, rhonchi Heart: Regular rate and rhythm. No gallops, murmurs or rub Abdomen: soft nontender,  nondistended, with normal bowel sounds Extremities: no cyanosis, clubbing, or edema Peripheral Pulses: 2+ in all extremities, 2+ femoral pulses bilaterally  Assessment   64 y.o. female with  1. Other hyperlipidemia  2. Chronic diastolic CHF (congestive  heart failure) (CMS-HCC)  3. Borderline hypertension  4. Allergic rhinitis, unspecified seasonality, unspecified trigger  5. Nephrolithiasis  6. Scoliosis of lumbar spine, unspecified scoliosis type  7. Osteoporosis, post-menopausal   Plan  1 occasional shortness of breath unclear etiology would consider further assessment possibly with echocardiogram 2 hyperlipidemia chronic stable continue diet and exercise 3 hypertension reasonably controlled patient currently on losartan 4 sinus congestion continue Flonase Singulair 5 scoliosis chronic stable continue conservative therapy 6 recommend conservative therapy at this point 7 have the patient follow-up as needed  No follow-ups on file.  Stephanie Salome Arnt, MD  This dictation was prepared with dragon dictation. Any transcription errors that result from this process are unintentional.   Electronically signed by Stephanie Capuchin, MD at 07/10/2018 10:34 AM EST  Plan of Treatment - documented as of this encounter Upcoming Encounters Upcoming Encounters  Date Type Specialty Care Team Description  12/15/2019 [facility] Encounter Gastroenterology Elnita Maxwell, MD  [address] [place]  [place], [place] [address]  [phone]  [phone] (Fax)    12/15/2019 Surgery Gastroenterology Elnita Maxwell, MD  [address]  [place], [place] [address]  [phone]  [phone] (Fax)  TN COLONOSCOPY, FLEXIBLE; DIAGNOSTIC, INCLUDING COLLECTION OF SPECIMEN(S) BY BRUSHING OR WASHING, WHEN PERFORMED (SEPARATE PROCEDURE)  12/16/2019 Ancillary Orders Lab Sallee Provencal, MD  [address]  [facility] [place], [place] [address]  [phone]  [phone] (Fax)    12/19/2019 Telemedicine Obstetrics and Gynecology Schermerhorn, Sabas Sous, MD  [address]  [facility]  [place], [place] [address]   [phone]  [phone] (Fax)    01/01/2020 Office Visit Internal Medicine Sallee Provencal, MD  [address]  [facility] [place], [place] [address]  [phone]  [phone] (Fax)    Goals - documented as of this encounter Goal Patient Goal Type Associated Problems Recent Progress Patient-Stated? Author  Plan meals  Lifestyle  On track (06/25/2019 2:59 PM EST) No Rogelia Rohrer, CMA  Note:   Formatting of this note might be different from the original. Eating healthier   Visit Diagnoses - documented in this encounter Diagnosis  Other hyperlipidemia - Primary   Chronic diastolic CHF (congestive heart failure) (CMS-HCC)   Borderline hypertension  Elevated blood pressure reading without diagnosis of hypertension   Allergic rhinitis, unspecified seasonality, unspecified trigger   Nephrolithiasis  Calculus of kidney   Scoliosis of lumbar spine, unspecified scoliosis type   Osteoporosis, post-menopausal  Senile osteoporosis   Iron deficiency anemia, unspecified iron deficiency anemia type   Gastroesophageal reflux disease, unspecified whether esophagitis present   Personal history of colonic polyps   Images Patient Demographics  Patient Address Communication Language Race / Ethnicity Marital Status  [address] [place], [place] [address] [phone] [facility]) [phone] (Home) [phone] (Work) [email or web address] English (Preferred) White / Not Hispanic or Latino Married  Patient Contacts  Contact Name Contact Address Communication Relationship to Patient  Nolan Lasser Unknown [phone] [facility]) Spouse, Emergency Contact  Document Information  Primary Care Provider Other Service Providers Document Coverage Dates  Teena Dunk III, MD (Apr. 14, 2015April 14, 2015 - Present) [phone] (Work) [phone] (Fax) [address]  [facility] [place] [place] [address] Internal Medicine [facility] [address] [place], [place] [address]  Feb. 18, 2020February 18, 2020   Custodian Organization  [facility] [address] [place], [place] [address]   Encounter Providers Encounter Date  Stephanie Dorthey Sawyer, MD (Attending) [phone] (Work) [phone] (Fax) [address] [place], [place] [address] Cardiovascular Disease Feb. 18, 2020February 18, 2020

## 2019-11-28 NOTE — Pre-Procedure Instructions (Signed)
Echo complete   Ref Range & Units 1 yr ago  LV Ejection Fraction (%)  55      Aortic Valve Stenosis Grade  none      Aortic Valve Regurgitation Grade  trivial      Mitral Valve Stenosis Grade  none      Mitral Valve Regurgitation Grade  moderate      Tricuspid Valve Regurgitation Grade  moderate      Tricuspid Valve Regurgitation Max Velocity (m/s) m/sec 3.3      Right Ventricle Systolic Pressure (mmHg) mmHg 16.1      LV End Diastolic Diameter (cm) cm 4.5      LV End Systolic Diameter (cm) cm 2.9      LV Septum Wall Thickness (cm) cm 1.3      LV Posterior Wall Thickness (cm) cm 1.2      Left Atrium Diameter (cm) cm 4.4      Narrative           INTERNAL MEDICINE DEPARTMENT       Stephanie, Cooper      [facility]                  [id number]      A Stephanie Cooper              Acct #: [id number]      [address] [place], [place] [address]   Date: 06/24/2018 04: 76 PM                                Adult  Female  Age: 64 yrs      ECHOCARDIOGRAM REPORT               Outpatient                                KC^^KCWI    STUDY:CHEST WALL        TAPE:0000: 00: 0: 00: 00 MD1: Cooper, Stephanie JACK    ECHO:Yes  DOPPLER:Yes    FILE:0000-000-000    BP: 138/76 mmHg    COLOR:Yes  CONTRAST:No   MACHINE:Philips  RV BIOPSY:No     3D:No SOUND QLTY:Moderate      Height: 68 in   MEDIUM:None                       Weight: 214 lb                                BSA: 2.1 m2  _________________________________________________________________________________________        HISTORY: DOE         REASON: Assess, LV function       INDICATION:     INDICATION:  INDICATION: Chronic diastolic CHF                               (congestive heart failure)                              (CMS-HCC) [I50.  _________________________________________________________________________________________  ECHOCARDIOGRAPHIC MEASUREMENTS  2D DIMENSIONS  AORTA         Values  Normal Range  MAIN PA  Values  Normal Range        Annulus: nm*     [2.1-2.5]     PA Main: nm*    [1.5-2.1]       Aorta Sin: nm*     [2.7-3.3]  RIGHT VENTRICLE      ST Junction: nm*     [2.3-2.9]     RV Base: 4.5 cm  [< 4.2]       Asc.Aorta: nm*     [2.3-3.1]     RV Mid: nm*    [<3.5]  LEFT VENTRICLE                   RV Length: nm*    [<8.6]         LVIDd: 4.5 cm    [3.9-5.3]  INFERIOR VENA CAVA         LVIDs: 2.9 cm            Max. IVC: nm*    [<=2.1]           FS: 35.7 %    [>25]      Min. IVC: nm*          SWT: 1.3 cm    [0.5-0.9]  ------------------          PWT: 1.2 cm    [0.5-0.9]  nm* - not measured  LEFT ATRIUM        LA Diam: 4.4 cm    [2.7-3.8]      LA A4C Area: nm*     [<20]       LA Volume: nm*     [22-52]  _________________________________________________________________________________________  ECHOCARDIOGRAPHIC DESCRIPTIONS  AORTIC ROOT          Size: Normal       Dissection: INDETERM FOR DISSECTION  AORTIC VALVE        Leaflets: Tricuspid          Morphology: Normal        Mobility: Fully mobile  LEFT VENTRICLE          Size: Normal            Anterior: Normal      Contraction: Normal             Lateral: Normal       Closest EF: >55% (Estimated)        Septal: Normal       LV Masses: No Masses            Apical: Normal           LVH: MODERATE LVH CONCENTRIC    Inferior: Normal                           Posterior: Normal      Dias.FxClass: (Grade 1) relaxation abnormal, E/A reversal  MITRAL VALVE        Leaflets: Normal            Mobility: Fully mobile       Morphology: Normal  LEFT ATRIUM          Size: MILDLY ENLARGED      LA Masses: No masses       IA Septum: Normal IAS  MAIN PA          Size: Normal  PULMONIC VALVE       Morphology: Normal            Mobility: Fully mobile  RIGHT  VENTRICLE       RV Masses: No Masses             Size: Normal       Free Wall: Normal           Contraction: Normal  TRICUSPID VALVE        Leaflets: Normal            Mobility: Fully mobile       Morphology: Normal  RIGHT ATRIUM          Size: MILDLY ENLARGED        RA Other: None        RA Mass: No masses  PERICARDIUM         Fluid: No effusion  INFERIOR VENACAVA          Size: Normal Normal respiratory collapse  _________________________________________________________________________________________   DOPPLER ECHO and OTHER SPECIAL PROCEDURES         Aortic: TRIVIAL AR         No AS         Mitral: MODERATE MR        No MS             MV Inflow E Vel = 103.0cm/sec   MV Annulus E'Vel = 6.1 cm/sec             E/E'Ratio = 16.9       Tricuspid: MODERATE TR        No TS             325.8 cm/sec peak TR vel  50.5 mmHg peak RV pressure       Pulmonary: TRIVIAL PR         No PS  _________________________________________________________________________________________  INTERPRETATION  NORMAL LEFT VENTRICULAR SYSTOLIC FUNCTION  NORMAL RIGHT VENTRICULAR SYSTOLIC FUNCTION  MODERATE VALVULAR  REGURGITATION (See above)  NO VALVULAR STENOSIS  Moderate concentric LVH  Moderately elevated pulmonary pressures  _________________________________________________________________________________________  Electronically signed by   Stephanie Penton, MD on 06/24/2018 06: 11 PM      Performed By: Stephanie Cooper, RCS   Ordering Physician: Stephanie Cooper  _________________________________________________________________________________________ Other Result Text  Interface, Text Results In - 06/24/2018 6:12 PM EST           INTERNAL MEDICINE DEPARTMENT       Cooper, Stephanie       [facility]                  [id number]       A Stephanie Cooper             Acct #: [id number]       [address] [name], [place] [address]   Date: 06/24/2018 04: 49 PM                                Adult  Female  Age: 76 yrs       ECHOCARDIOGRAM REPORT               Outpatient                                KC^^KCWI    STUDY:CHEST WALL        TAPE:0000: 00: 0: 00: 00 MD1: Stephanie Cooper     ECHO:Yes  DOPPLER:Yes  FILE:0000-000-000    BP: 138/76 mmHg    COLOR:Yes  CONTRAST:No   MACHINE:Philips  RV BIOPSY:No     3D:No SOUND QLTY:Moderate      Height: 68 in    MEDIUM:None                       Weight: 214 lb                               BSA: 2.1 m2  _________________________________________________________________________________________         HISTORY: DOE         REASON: Assess, LV function       INDICATION:     INDICATION:  INDICATION: Chronic diastolic CHF                               (congestive heart failure)                               (CMS-HCC)  [I50.  _________________________________________________________________________________________  ECHOCARDIOGRAPHIC MEASUREMENTS  2D DIMENSIONS  AORTA         Values  Normal Range  MAIN PA     Values  Normal Range         Annulus: nm*     [2.1-2.5]     PA Main: nm*    [1.5-2.1]        Aorta Sin: nm*     [2.7-3.3]  RIGHT VENTRICLE       ST Junction: nm*     [2.3-2.9]     RV Base: 4.5 cm  [< 4.2]        Asc.Aorta: nm*     [2.3-3.1]     RV Mid: nm*    [<3.5]  LEFT VENTRICLE                  RV Length: nm*    [<8.6]          LVIDd: 4.5 cm    [3.9-5.3]  INFERIOR VENA CAVA          LVIDs: 2.9 cm            Max. IVC: nm*    [<=2.1]           FS: 35.7 %    [>25]      Min. IVC:nm*           SWT: 1.3 cm    [0.5-0.9]  ------------------           PWT: 1.2 cm    [0.5-0.9]  nm* - not measured  LEFT ATRIUM         LA Diam: 4.4 cm    [2.7-3.8]       LA A4C Area: nm*     [<20]       LA Volume: nm*     [22-52]  _________________________________________________________________________________________  ECHOCARDIOGRAPHIC DESCRIPTIONS  AORTIC ROOT          Size: Normal       Dissection: INDETERM FOR DISSECTION  AORTIC VALVE        Leaflets: Tricuspid          Morphology: Normal        Mobility: Fully mobile  LEFT VENTRICLE          Size: Normal  Anterior: Normal       Contraction: Normal            Lateral: Normal       Closest EF: >55% (Estimated)        Septal: Normal        LV Masses: No Masses            Apical: Normal           LVH: MODERATE LVH CONCENTRIC    Inferior: Normal                           Posterior: Normal       Dias.FxClass: (Grade 1) relaxation abnormal, E/A reversal  MITRAL VALVE        Leaflets: Normal            Mobility: Fully mobile       Morphology: Normal  LEFT ATRIUM          Size: MILDLY ENLARGED       LA Masses: No masses        IA Septum: Normal IAS  MAIN PA          Size: Normal  PULMONIC VALVE       Morphology: Normal            Mobility: Fully mobile  RIGHT VENTRICLE        RV Masses: No Masses             Size: Normal        Free Wall: Normal           Contraction: Normal  TRICUSPID VALVE        Leaflets: Normal            Mobility: Fully mobile      Morphology: Normal  RIGHT ATRIUM          Size: MILDLY ENLARGED        RA Other: None         RA Mass: No masses  PERICARDIUM          Fluid: No effusion  INFERIOR VENACAVA          Size: Normal Normal respiratory collapse  _________________________________________________________________________________________   DOPPLER ECHO and OTHER SPECIAL PROCEDURES         Aortic: TRIVIAL AR         No AS         Mitral: MODERATE MR        No MS             MV Inflow E Vel = 103.0 cm/sec   MV Annulus E'Vel = 6.1 cm/sec             E/E'Ratio = 16.9        Tricuspid: MODERATE TR        No TS             325.8 cm/sec peak TR vel  50.5 mmHg peak RV pressure        Pulmonary: TRIVIAL PR         No PS  _________________________________________________________________________________________  INTERPRETATION  NORMAL LEFT VENTRICULAR SYSTOLIC FUNCTION  NORMAL RIGHT VENTRICULAR SYSTOLIC FUNCTION  MODERATE VALVULAR REGURGITATION (See above)  NO VALVULAR STENOSIS  Moderate concentric LVH  Moderately elevated pulmonary pressures   _________________________________________________________________________________________  Electronically signed by   Stephanie PentonMark F Miller, MD on 06/24/2018 06: 11 PM      Performed  By: Stephanie Cooper, RCS   Ordering Physician: Stephanie Cooper  _________________________________________________________________________________________ Specimen Collected: --  Date: 06/24/18  Received From: [facility]   Encounter Summary

## 2019-11-28 NOTE — Pre-Procedure Instructions (Signed)
Progress Notes - documented in this encounter Cheryll Cockayne, MD - 12/23/2018 3:30 PM EDT Formatting of this note is different from the original. Stephanie Cooper is seen for hypertension, osteoporosis, hyperlipidemia, allergies, diastolic CHF. Doing well overall; no evidence of fluid overload. She has been seen by cardiology; echo 2/10 with moderate MR, TR. Blood pressure has been stable. Limiting sodium. 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 Her allergies are still not controlled but she has been using the Flonase consistently. She does get some heartburn about twice a week which requires the use of Tums or Rolaids; wonders what she can use in place of this.  Current Outpatient Medications  Medication Sig Dispense Refill   alendronate (FOSAMAX) 70 MG tablet Take 1 tablet (70 mg total) by mouth every 7 (seven) days Take with a full glass of water. Do not lie down for the next 30 min. 13 tablet 4   CALCIUM ORAL Take by mouth One daily   ergocalciferol, vitamin D2, (VITAMIN D3 ORAL) Take 600 Units by mouth once daily   fluticasone (FLONASE) 50 mcg/actuation nasal spray Place 2 sprays into both nostrils once daily   losartan (COZAAR) 25 MG tablet TAKE 1 TABLET(25 MG) BY MOUTH EVERY DAY 90 tablet 1   montelukast (SINGULAIR) 10 mg tablet Take 1 tablet (10 mg total) by mouth nightly 90 tablet 3   No current facility-administered medications for this visit.   Allergies as of 12/23/2018 - Reviewed 12/23/2018  Allergen Reaction Noted   Lisinopril Cough 01/11/2016   Prednisone Other (See Comments) 12/09/2015   Past Medical History:  Diagnosis Date   Allergic rhinitis   Borderline hypertension  Diet controlled.   Chronic diastolic CHF (congestive heart failure) (CMS-HCC) 7/67/2094  Grade 2 diastolic dysfunction by echocardiogram 4/17. LVEF > 60%. Myoview 4/17 was negative for ischemia   History of  lower GI bleeding 10/2002   Hyperlipidemia   Nephrolithiasis   Osteoporosis, post-menopausal   Scoliosis   Past Surgical History:  Procedure Laterality Date   COLONOSCOPY 02/27/2012  Dr. Mamie Nick. Oh @ [place]   COLONOSCOPY 11/11/2004, 10/21/2002  Dr. Ivor Messier @ [place]   LITHOTRIPSY 09/23/09   Ovarian wedge resection 1980s   Health Maintenance Topics with due status: Overdue   Topic Date Due  Shingrix 08/16/2003  Colonoscopy 02/26/2017  Mammogram 06/11/2018  Pneumo Low Risk Adult 08/16/2018   Health Maintenance Topics with due status: Due Soon   Topic Date Due  Influenza Vaccine 01/14/2019   Family History  Problem Relation Age of Onset   Pulmonary embolism Mother  s/p GI hemorrhage   High blood pressure (Hypertension) Father   Social History   Socioeconomic History   Marital status: Married  Spouse name: Not on file   Number of children: Not on file   Years of education: Not on file   Highest education level: Not on file  Occupational History   Not on file  Social Needs   Financial resource strain: Not on file   Food insecurity:  Worry: Not on file  Inability: Not on file   Transportation needs:  Medical: Not on file  Non-medical: Not on file  Tobacco Use   Smoking status: Never Smoker   Smokeless tobacco: Never Used  Substance and Sexual Activity   Alcohol use: Yes  Alcohol/week: 0.0 standard drinks   Drug use: No   Sexual  activity: Yes  Birth control/protection: Post-menopausal  Other Topics Concern   Not on file  Social History Narrative   Not on file   Goals Addressed      This Visit's Progress   Plan meals On track  Eating healthier    Results for orders placed or performed in visit on 12/16/18  Comprehensive Metabolic Panel (CMP)  Result Value Ref Range  Glucose 95 70 - 110 mg/dL  Sodium 142 136 - 145 mmol/L  Potassium 4.6 3.6 - 5.1 mmol/L  Chloride 107 97 - 109 mmol/L  Carbon Dioxide (CO2) 27.9  22.0 - 32.0 mmol/L  Urea Nitrogen (BUN) 18 7 - 25 mg/dL  Creatinine 0.9 0.6 - 1.1 mg/dL  Glomerular Filtration Rate (eGFR), MDRD Estimate 63 >60 mL/min/1.73sq m  Calcium 9.7 8.7 - 10.3 mg/dL  AST 15 8 - 39 U/L  ALT 9 5 - 38 U/L  Alk Phos (alkaline Phosphatase) 55 34 - 104 U/L  Albumin 4.0 3.5 - 4.8 g/dL  Bilirubin, Total 0.4 0.3 - 1.2 mg/dL  Protein, Total 6.4 6.1 - 7.9 g/dL  A/G Ratio 1.7 1.0 - 5.0 gm/dL  Microalbumin/Creatinine Ratio, Random Urine  Result Value Ref Range  Creatinine, Random Urine 112.2 37.0 - 250.0 mg/dL  Urine Albumin, Random 52 mg/L  Urine Albumin/Creatinine Ratio 46.3 (H) <30.0 ug/mg  Lipid Panel w/calc LDL  Result Value Ref Range  Cholesterol, Total 200 100 - 200 mg/dL  Triglyceride 98 35 - 199 mg/dL  HDL (High Density Lipoprotein) Cholesterol 52.7 35.0 - 85.0 mg/dL  LDL Calculated 128 0 - 130 mg/dL  VLDL Cholesterol 20 mg/dL  Cholesterol/HDL Ratio 3.8  Urinalysis w/Microscopic  Result Value Ref Range  Color Yellow Yellow  Clarity Clear Clear  Specific Gravity 1.020 1.000 - 1.030  pH, Urine 6.5 5.0 - 8.0  Protein, Urinalysis Negative Negative, Trace mg/dL  Glucose, Urinalysis Negative Negative mg/dL  Ketones, Urinalysis Negative Negative mg/dL  Blood, Urinalysis Trace (!) Negative  Nitrite, Urinalysis Negative Negative  Leukocyte Esterase, Urinalysis Moderate (!) Negative  White Blood Cells, Urinalysis 10-50 (!) None Seen, 0-3 /hpf  Red Blood Cells, Urinalysis 4-10 (!) None Seen, 0-3 /hpf  Bacteria, Urinalysis Rare (!) None Seen /hpf  Squamous Epithelial Cells, Urinalysis Few Rare, Few, None Seen /hpf  Other Epithelial, Urinalysis None Seen None Seen  Vitamin D, 25-Hydroxy - [facility]  Result Value Ref Range  Vitamin D, 25-Hydroxy - [facility] 32.6 30.0 - 100.0 ng/mL  Narrative  Performed at: [facility] [address] [place], [place] [id number] Lab Director: Rush Farmer MD, Phone: [phone]  CBC w/auto Differential (5 Part)  Result  Value Ref Range  WBC (White Blood Cell Count) 6.0 4.1 - 10.2 103/uL  RBC (Red Blood Cell Count) 4.04 4.04 - 5.48 106/uL  Hemoglobin 11.0 (L) 12.0 - 15.0 gm/dL  Hematocrit 35.0 35.0 - 47.0 %  MCV (Mean Corpuscular Volume) 86.6 80.0 - 100.0 fl  MCH (Mean Corpuscular Hemoglobin) 27.2 27.0 - 31.2 pg  MCHC (Mean Corpuscular Hemoglobin Concentration) 31.4 (L) 32.0 - 36.0 gm/dL  Platelet Count 351 150 - 450 103/uL  RDW-CV (Red Cell Distribution Width) 13.7 11.6 - 14.8 %  MPV (Mean Platelet Volume) 11.4 9.4 - 12.4 fl  Neutrophils 2.91 1.50 - 7.80 103/uL  Lymphocytes 2.26 1.00 - 3.60 103/uL  Monocytes 0.54 0.00 - 1.50 103/uL  Eosinophils 0.26 0.00 - 0.55 103/uL  Basophils 0.06 0.00 - 0.09 103/uL  Neutrophil % 48.2 32.0 - 70.0 %  Lymphocyte % 37.4 10.0 -  50.0 %  Monocyte % 8.9 4.0 - 13.0 %  Eosinophil % 4.3 1.0 - 5.0 %  Basophil% 1.0 0.0 - 2.0 %  Immature Granulocyte % 0.2 <=0.7 %  Immature Granulocyte Count 0.01 <=0.06 10^3/L    ROS: Please see HPI;remainder of complete 10 point ROS is negative  BP 122/80   Pulse 95   Ht 175.3 cm ('5\' 9"' )   Wt 97.9 kg (215 lb 12.8 oz)   SpO2 98%   BMI 31.87 kg/m   GENERAL: The patient is alert, oriented and in no acute distress.  HEENT: Pupils equal and round. Extraocular motion intact. Oropharynx moist without lesions.  NECK: No thyromegaly or lymphadenopathy.  CHEST: Chest wall is within normal limits.  LUNGS: Clear to auscultation and percussion. No retractions or wheezing.  CARDIAC: Regular rate and rhythm, with 2/6 systolic murmur right upper sternal border; no rubs or gallops.  VASCULAR: Carotid and radial pulses are 2+. Distal pulses 2+ ABDOMEN: Soft, nontender, nondistended, positive bowel sounds. No guard or rebound. EXTREMITIES: No clubbing or cyanosis. Trace ankle edema.  NEUROLOGIC: Cranial nerves grossly intact. Gait intact DERMATOLOGIC: Multiple angiomas without significant rashes or nodules. LYMPH: No cervical or  supraclavicular lymphadenopathy .   Chronic diastolic CHF (congestive heart failure) (CMS-HCC) (primary encounter diagnosis) Osteoporosis, post-menopausal Scoliosis of lumbar spine, unspecified scoliosis type Other hyperlipidemia Borderline hypertension  Assessment and Plan  Hypertension/chronic diastolic congestive heart failure. Fluid status stable; continue monitor sodium and follow blood pressure, weight at home. Encourage attempts at weight loss. She has been evaluated by cardiology. Monitor electrolytes, urinalysis, TSH periodically. Monitor echocardiogram periodically Hyperlipidemia. Controlled; continue lifestyle efforts. Follow liver, lipids, TSH periodically Nephrolithiasis. No recent symptoms. Has had periodic microscopic hematuria, thought to be due to this as well as some bladder changes; repeat urinalysis and culture today. Follow urinalysis, CBC periodically Scoliosis/arthritis. No new joint issues; encourage core muscle strengthening and regular physical activity Allergic rhinitis. Continue Singulair and try adding Flonase consistently. Consider return to ENT. Osteoporosis. Continue calcium, vitamin D. Follow vitamin D level, DEXA scan periodically 7. Anemia. Stable; Hemoccults have previously been negative. Following CBC 8. Health Maintenance. Annual exam in 6 months, returning sooner if needed.   Dendron III, MD   Portions of this note were created using dictation software and may contain typographical errors.   Electronically signed by Cheryll Cockayne, MD at 12/23/2018 4:00 PM EDT  Plan of Treatment - documented as of this encounter Upcoming Encounters Upcoming Encounters  Date Type Specialty Care Team Description  12/15/2019 [facility] Encounter Gastroenterology Josefine Class, MD  [place]  [place], [place] [address]  [phone]  [phone] (Fax)    12/15/2019 Surgery Gastroenterology Josefine Class, MD  [place]  [place], [place] [address]  [phone]  [phone] (Fax)  [place] COLONOSCOPY, FLEXIBLE; DIAGNOSTIC, INCLUDING COLLECTION OF SPECIMEN(S) BY BRUSHING OR WASHING, WHEN PERFORMED (SEPARATE PROCEDURE)  12/16/2019 Ancillary Orders Lab Cheryll Cockayne, MD  [address]  [facility] [place], [place] [address]  [phone]  [phone] (Fax)    12/19/2019 Telemedicine Obstetrics and Gynecology [place], [place], [place]  [facility]  [place], [place] [address]  [phone]  [phone] (Fax)    01/01/2020 Office Visit Internal Medicine Cheryll Cockayne, [place]  [facility] [place], [place] [address]  [phone]  [phone] (Fax)  Goals - documented as of this encounter Goal Patient Goal Type Associated Problems Recent Progress Patient-Stated? Author  Plan meals  Lifestyle  On track (06/25/2019 2:59 PM EST) No Keene Breath, CMA  Note:   Formatting of this note might be different from the original. Eating healthier   Procedures - documented in this encounter Procedure Name Priority Date/Time Associated Diagnosis Comments  URINE CULTURE, ROUTINE - [facility] Routine 12/23/2018 4:02 PM EDT Hematuria, microscopic  Results for this procedure are in the results section.   URINALYSIS W/MICROSCOPIC - [facility] Routine 12/23/2018 4:02 PM EDT Hematuria, microscopic  Results for this procedure are in the results section.   Lab Results - documented in this encounter Table of Contents for Lab Results  Urinalysis w/Microscopic (06/18/2019 7:10 AM EST)  Thyroid Stimulating-Hormone (TSH) (06/18/2019 7:10 AM EST)  Lipid Panel w/calc LDL (06/18/2019 7:10 AM EST)  Comprehensive Metabolic Panel (CMP) (96/78/9381 7:10 AM EST)  CBC w/auto Differential (5 Part) (06/18/2019 7:10 AM EST)  Urine  Culture, Routine - [facility] (12/23/2018 4:02 PM EDT)  Urinalysis w/Microscopic (12/23/2018 4:02 PM EDT)     Urinalysis w/Microscopic (06/18/2019 7:10 AM EST) Urinalysis w/Microscopic (06/18/2019 7:10 AM EST)  Component Value Ref Range Performed At Pathologist Signature  Color Yellow Yellow [facility]   Clarity SL Cloudy (A) Clear [facility]   Specific Gravity 1.020 1.000 - 1.030 [facility]   pH, Urine 6.5 5.0 - 8.0 [facility]   Protein, Urinalysis Negative Negative, Trace mg/dL [facility]   Glucose, Urinalysis Negative Negative mg/dL [facility]   Ketones, Urinalysis Negative Negative mg/dL [facility]   Blood, Urinalysis Trace (A) Negative [facility]   Nitrite, Urinalysis Negative Negative [facility]   Leukocyte Esterase, Urinalysis Large (A) Negative [facility]   White Blood Cells, Urinalysis >50 (A) None Seen, 0-3 /hpf [facility]   Red Blood Cells, Urinalysis 4-10 (A) None Seen, 0-3 /hpf [facility]   Bacteria, Urinalysis Moderate (A) None Seen /hpf [facility]   Squamous Epithelial Cells, Urinalysis Moderate (A) Rare, Few, None Seen /hpf [facility]    Urinalysis w/Microscopic (06/18/2019 7:10 AM EST)  Specimen  Urine   Urinalysis w/Microscopic (06/18/2019 7:10 AM EST)  Performing Organization Address City/State/ZIP Code Phone Number  [facility] - LAB  [place], [place] [address]      Back to top of Lab Results    Thyroid Stimulating-Hormone (TSH) (06/18/2019 7:10 AM EST) Thyroid Stimulating-Hormone (TSH) (06/18/2019 7:10 AM EST)  Component Value Ref Range Performed At Pathologist Signature  Thyroid Stimulating Hormone (TSH) 3.512 Comment:  Reference Range for Pregnant Females >= 10 yrs old: Normal Range for  1st trimester: 0.05-3.70 ulU/ml Normal Range for 2nd trimester: 0.31-4.35 ulU/ml 0.450-5.330 uIU/ml uIU/mL [facility]    Thyroid Stimulating-Hormone (TSH) (06/18/2019 7:10 AM EST)  Specimen  Blood   Thyroid Stimulating-Hormone (TSH) (06/18/2019 7:10 AM EST)  Performing Organization Address City/State/ZIP Code Phone Number  [facility] - LAB  [place], [place] [address]      Back to top of Lab Results    Lipid Panel w/calc LDL (06/18/2019 7:10 AM EST) Lipid Panel w/calc LDL (06/18/2019 7:10 AM EST)  Component Value Ref  Range Performed At Pathologist Signature  Cholesterol, Total 190 100 - 200 mg/dL [place] - LAB   Triglyceride 75 35 - 199 mg/dL [place] - LAB   HDL (High Density Lipoprotein) Cholesterol 60.1 35.0 - 85.0 mg/dL [facility]   LDL Calculated 115 0 - 130 mg/dL [facility]   VLDL Cholesterol 15 mg/dL [facility] - [facility]   Cholesterol/HDL Ratio 3.2  [place] - LAB    Lipid Panel w/calc LDL (06/18/2019 7:10 AM EST)  Specimen  Blood   Lipid Panel w/calc LDL (06/18/2019 7:10 AM EST)  Performing Organization Address City/State/ZIP Code Phone Number  [place]  [place], [place] [address]      Back to top of Lab Results    Comprehensive Metabolic Panel (CMP) (44/05/270 7:10 AM EST) Comprehensive Metabolic Panel (CMP) (53/66/4403 7:10 AM EST)  Component Value Ref Range Performed At Pathologist Signature  Glucose 107 70 - 110 mg/dL [facility]   Sodium 143 136 - 145 mmol/L [facility]   Potassium 4.4 3.6 - 5.1 mmol/L [facility]   Chloride 107 97 - 109 mmol/L [facility]   Carbon Dioxide (CO2) 28.3 22.0 - 32.0 mmol/L [facility]   Urea Nitrogen (BUN) 18 7 - 25 mg/dL [facility]   Creatinine 0.8 0.6 -  1.1 mg/dL [facility]   Glomerular Filtration Rate (eGFR), MDRD Estimate 72 >60 mL/min/1.73sq m [facility]   Calcium 9.6 8.7 - 10.3 mg/dL [facility]   AST  17 8 - 39 U/L [facility]   ALT  11 5 - 38 U/L [facility]   Alk Phos (alkaline Phosphatase) 61 34 - 104 U/L [facility]   Albumin 3.9 3.5 - 4.8 g/dL [facility]   Bilirubin, Total 0.4 0.3 - 1.2 mg/dL [facility]   Protein, Total 6.9 6.1 - 7.9 g/dL [facility]   A/G Ratio 1.3 1.0 - 5.0 gm/dL [facility]    Comprehensive Metabolic Panel (CMP) (47/42/5956 7:10 AM EST)  Specimen  Blood   Comprehensive Metabolic Panel (CMP) (38/75/6433 7:10 AM EST)  Performing Organization Address City/State/ZIP Code Phone Number  [place]  [place], [place] [address]      Back to top of Lab Results    CBC w/auto Differential (5 Part) (06/18/2019 7:10 AM EST) CBC w/auto Differential (5 Part) (06/18/2019 7:10 AM EST)  Component Value Ref Range Performed At Pathologist Signature  WBC (White Blood Cell Count) 6.9 4.1 - 10.2 103/uL [facility]   RBC (Red Blood Cell Count) 3.98 (L) 4.04 - 5.48 106/uL [facility]   Hemoglobin 9.5 (L) 12.0 - 15.0 gm/dL [facility]   Hematocrit 32.0 (L) 35.0 - 47.0 % [facility]   MCV (Mean Corpuscular Volume) 80.4 80.0 - 100.0 fl [facility]   MCH (Mean Corpuscular Hemoglobin) 23.9 (L) 27.0 - 31.2 pg [facility]   MCHC (Mean Corpuscular Hemoglobin Concentration) 29.7 (L) 32.0 - 36.0 gm/dL [facility]   Platelet  Count 452 (H) 150 - 450 103/uL [place] (Red Cell Distribution Width) 14.8 11.6 - 14.8 % [facility]   MPV (Mean Platelet Volume) 10.9 9.4 - 12.4 fl [place] - LAB   Neutrophils 3.70 1.50 - 7.80 103/uL [place] - LAB   Lymphocytes 2.11 1.00 - 3.60 103/uL [place] - LAB   Monocytes 0.79 0.00 - 1.50 103/uL [facility]   Eosinophils 0.26 0.00 - 0.55 103/uL [facility]   Basophils 0.07 0.00 - 0.09 103/uL [facility]   Neutrophil % 53.4 32.0 - 70.0 % [facility]   Lymphocyte % 30.4 10.0 - 50.0 % [facility]   Monocyte % 11.4 4.0 - 13.0 % [facility]   Eosinophil % 3.7 1.0 - 5.0 % [facility]   Basophil% 1.0 0.0 - 2.0 % [facility]   Immature Granulocyte % 0.1 <=0.7 % [facility]   Immature Granulocyte Count 0.01 <=0.06 10^3/L [place] - LAB    CBC w/auto Differential (5 Part) (06/18/2019 7:10 AM EST)  Specimen  Blood   CBC w/auto Differential (5 Part) (06/18/2019 7:10 AM EST)  Performing Organization Address City/State/ZIP Code Phone Number  [facility] - LAB  [place], [place] [phone]      Back to top of Lab Results    Urine Culture, Routine - [facility] (12/23/2018 4:02 PM EDT) Urine Culture, Routine - [facility] (12/23/2018 4:02 PM EDT)  Component Value Ref Range Performed At Pathologist Signature  Urine Culture, Routine - [facility] Final report  [facility]   Result 1 - [facility] Comment Comment:  Mixed urogenital flora Less than 10,000 colonies/mL  [facility]    Urine Culture, Routine - [facility] (12/23/2018 4:02 PM EDT)  Specimen  Urine - Urinary bladder structure (body structure)   Urine Culture, Routine - [facility] (12/23/2018 4:02 PM EDT)  Narrative Performed At  Performed at: [address] [facility]  [address] [place], [place] [id number]  Lab Director: Rush Farmer MD, Phone: [phone]  Franchot Mimes    Urine Culture, Routine - [facility] (12/23/2018 4:02 PM EDT)   Performing Organization Address City/State/ZIP Code Phone Number  [facility]        Back to top of Lab Results    Urinalysis w/Microscopic (12/23/2018 4:02 PM EDT) Urinalysis w/Microscopic (12/23/2018 4:02 PM EDT)  Component Value Ref Range Performed At Pathologist Signature  Color Yellow Yellow [place] - LAB   Specific Gravity 1.025 1.000 - 1.030 [place] - LAB   pH, Urine 6.0 5.0 - 8.0 [place] - LAB   Protein, Urinalysis Negative Negative, Trace mg/dL [place] - LAB   Glucose, Urinalysis Negative Negative mg/dL [place] - LAB   Ketones, Urinalysis Negative Negative mg/dL [place] - LAB   Blood, Urinalysis Trace (A) Negative [facility]   Nitrite, Urinalysis Negative Negative [place] - LAB   Leukocyte Esterase, Urinalysis Small (A) Negative [place] - LAB   White Blood Cells, Urinalysis 4-10 (A) None Seen, 0-3 /hpf [place] - LAB   Red Blood Cells, Urinalysis 0-3 None Seen, 0-3 /hpf [place] -  LAB   Bacteria, Urinalysis Rare (A) None Seen /hpf [place] - LAB   Squamous Epithelial Cells, Urinalysis Rare Rare, Few, None Seen /hpf [facility] - LAB    Urinalysis w/Microscopic (12/23/2018 4:02 PM EDT)  Specimen  Urine   Urinalysis w/Microscopic (12/23/2018 4:02 PM EDT)  Narrative Performed At  Rare mucus strands present  [facility] - LAB    Urinalysis w/Microscopic (12/23/2018 4:02 PM EDT)  Performing Organization Address City/State/ZIP Code Phone Number  [facility] [place]  [address], [place] [address]      Back to top of Lab Results Visit Diagnoses - documented in this encounter Diagnosis  Chronic diastolic CHF (congestive heart failure) (CMS-HCC) - Primary   Osteoporosis, post-menopausal  Senile  osteoporosis   Scoliosis of lumbar spine, unspecified scoliosis type   Other hyperlipidemia   Borderline hypertension  Elevated blood pressure reading without diagnosis of hypertension   Hematuria, microscopic  Microscopic hematuria   Iron deficiency anemia, unspecified iron deficiency anemia type   Gastroesophageal reflux disease, unspecified whether esophagitis present   Personal history of colonic polyps   Images Patient Demographics  Patient Address Communication Language Race / Ethnicity Marital Status  [address] [place], [place] [address] [phone] [facility]) [phone] (Home) [phone] (Work) [email or web address] (Preferred) White / Not Hispanic or Latino Married  Patient Contacts  Contact Name Contact Address Communication Relationship to Patient  Shataya Winkles Unknown [phone] [facility]) Spouse, Emergency Contact  Document Information  Primary Care Provider Other Service Providers Document Coverage Dates  Cheryll Cockayne, MD (Apr. 14, 2015April 14, 2015 - Present) [phone] (Work) [phone] (Fax) [facility] [place], [place] [address] Internal Medicine [facility] [address] [place], [place] [address]  Aug. 10, 2020August 10, 2020 - Aug. 19, 2020August 19, 2020   [place] [address] [place], [place] [address]   Encounter Providers Encounter Date  Cheryll Cockayne, MD (Attending) [phone] (Work) [phone] (Fax) [facility] [place],  [address] Internal Medicine Aug. 10, 2020August 10, 2020 - Aug. 19, 2020August 19, 2020

## 2019-11-28 NOTE — Pre-Procedure Instructions (Signed)
NM myocardial perfusion SPECT multiple (stress and rest)  Impression  Normal myocardial perfusion scan no evidence of stress-induced  myocardial ischemia ejection fraction of 60% conclusion negative scan Narrative  CARDIOLOGY DEPARTMENT  [facility]  [facility]  [address] [id number]  [phone]   Procedure: Exercise Myocardial Perfusion Imaging  ONE day procedure   Indication: Angina at rest (CMS-HCC) - Plan: NM myocardial perfusion SPECT  multiple (stress and rest), ECG stress test only   Elevated troponin - Plan: NM myocardial perfusion SPECT multiple (stress  and rest), ECG stress test only  Ordering Physician:   Dr. Dorothyann Peng    Clinical History:  64 y.o. year old female recent anginal symptoms  Vitals: Height: 68 inWeight: 225 lb  Cardiac risk factors include:   Hyperlipidemia, HTN and Obesity    Procedure:  The patient performed treadmill exercise using a Bruce protocol for 7:00  minutes. The exercise test was stopped due to fatigue.Blood pressure  response was normal.   Rest HR: 75bpm  Rest BP: 132/34mmHg  Max HR: 162bpm  Max BP: 150/24mmHg  Mets: 10.10  % MAX HR: 102%   Stress Test Administered by: Percell Boston, CMA   ECGInterpretation:  Rest YIR:SWNIOE sinus rhythm, Diffuse ST elevation consistent with early  repolarization  Stress VOJ:JKKXF tachycardia,  Recovery GHW:EXHBZJ sinus rhythm  ECG Interpretation:negative, nondiagnostic changes.    Administrations This Visit  technetium Tc76m sestamibi (CARDIOLITE) injection 12.25 millicurie  Admin Date Action Dose Route Administered By     08/26/2015 Given 12.25 millicurie Intravenous Scott N Goard, CNMT       technetium Tc44m sestamibi (CARDIOLITE) injection 33.7 millicurie  Admin Date Action Dose Route Administered By     08/26/2015 Given 33.7 millicurie Intravenous Scott N Goard, CNMT           Gated post-stress perfusion imaging was performed 30 minutes after stress.  Rest images were performed 30 minutes after injection.   Gated LV Analysis:  TID:0.99   LVEF= 68 %   FINDINGS:  Regional wall motion:reveals normal myocardial thickening and wall  motion.  The overall quality of the study is good.  Artifacts noted: no  Left ventricular cavity: normal.   Perfusion Analysis:SPECT images demonstrate homogeneous tracer  distribution throughout the myocardium. Specimen Collected: --  Date: 08/26/15  Received From: [facility]

## 2019-12-04 ENCOUNTER — Other Ambulatory Visit: Payer: Self-pay

## 2019-12-04 ENCOUNTER — Other Ambulatory Visit: Payer: BC Managed Care – PPO

## 2019-12-04 ENCOUNTER — Encounter
Admission: RE | Admit: 2019-12-04 | Discharge: 2019-12-04 | Disposition: A | Discharge status: Home / Self Care | Payer: BC Managed Care – PPO | Source: Ambulatory Visit | Attending: Obstetrics and Gynecology | Admitting: Obstetrics and Gynecology

## 2019-12-04 DIAGNOSIS — I10 Essential (primary) hypertension: Secondary | ICD-10-CM | POA: Diagnosis not present

## 2019-12-04 DIAGNOSIS — Z20822 Contact with and (suspected) exposure to covid-19: Secondary | ICD-10-CM | POA: Diagnosis not present

## 2019-12-04 DIAGNOSIS — Z01818 Encounter for other preprocedural examination: Secondary | ICD-10-CM | POA: Diagnosis not present

## 2019-12-04 LAB — CBC
HCT: 36.1 % (ref 36.0–46.0)
Hemoglobin: 11.9 g/dL — ABNORMAL LOW (ref 12.0–15.0)
MCH: 28.2 pg (ref 26.0–34.0)
MCHC: 33 g/dL (ref 30.0–36.0)
MCV: 85.5 fL (ref 80.0–100.0)
Platelets: 288 10*3/uL (ref 150–400)
RBC: 4.22 MIL/uL (ref 3.87–5.11)
RDW: 14.4 % (ref 11.5–15.5)
WBC: 6.2 10*3/uL (ref 4.0–10.5)
nRBC: 0 % (ref 0.0–0.2)

## 2019-12-04 LAB — SARS CORONAVIRUS 2 (TAT 6-24 HRS): SARS Coronavirus 2: NEGATIVE

## 2019-12-04 LAB — BASIC METABOLIC PANEL
Anion gap: 11 (ref 5–15)
BUN: 18 mg/dL (ref 8–23)
CO2: 26 mmol/L (ref 22–32)
Calcium: 9.2 mg/dL (ref 8.9–10.3)
Chloride: 104 mmol/L (ref 98–111)
Creatinine, Ser: 0.86 mg/dL (ref 0.44–1.00)
GFR calc Af Amer: >60 mL/min (ref >60)
GFR calc non Af Amer: >60 mL/min (ref >60)
Glucose, Bld: 102 mg/dL — ABNORMAL HIGH (ref 70–99)
Potassium: 3.8 mmol/L (ref 3.5–5.1)
Sodium: 141 mmol/L (ref 135–145)

## 2019-12-04 LAB — TYPE AND SCREEN
ABO/RH(D): A POS
Antibody Screen: NEGATIVE

## 2019-12-05 ENCOUNTER — Encounter: Payer: Self-pay | Admitting: Obstetrics and Gynecology

## 2019-12-05 ENCOUNTER — Ambulatory Visit: Payer: BC Managed Care – PPO | Admitting: Anesthesiology

## 2019-12-05 ENCOUNTER — Encounter
Admission: RE | Disposition: A | Discharge status: Home / Self Care | Payer: Self-pay | Source: Home / Self Care | Attending: Obstetrics and Gynecology

## 2019-12-05 ENCOUNTER — Other Ambulatory Visit: Payer: Self-pay

## 2019-12-05 ENCOUNTER — Ambulatory Visit
Admission: RE | Admit: 2019-12-05 | Discharge: 2019-12-05 | Disposition: A | Discharge status: Home / Self Care | Payer: BC Managed Care – PPO | Attending: Obstetrics and Gynecology | Admitting: Obstetrics and Gynecology

## 2019-12-05 DIAGNOSIS — M810 Age-related osteoporosis without current pathological fracture: Secondary | ICD-10-CM | POA: Insufficient documentation

## 2019-12-05 DIAGNOSIS — I110 Hypertensive heart disease with heart failure: Secondary | ICD-10-CM | POA: Diagnosis not present

## 2019-12-05 DIAGNOSIS — N840 Polyp of corpus uteri: Secondary | ICD-10-CM | POA: Insufficient documentation

## 2019-12-05 DIAGNOSIS — J309 Allergic rhinitis, unspecified: Secondary | ICD-10-CM | POA: Diagnosis not present

## 2019-12-05 DIAGNOSIS — N950 Postmenopausal bleeding: Secondary | ICD-10-CM | POA: Insufficient documentation

## 2019-12-05 DIAGNOSIS — Z79899 Other long term (current) drug therapy: Secondary | ICD-10-CM | POA: Diagnosis not present

## 2019-12-05 DIAGNOSIS — I5032 Chronic diastolic (congestive) heart failure: Secondary | ICD-10-CM | POA: Insufficient documentation

## 2019-12-05 HISTORY — PX: DILATATION & CURETTAGE/HYSTEROSCOPY WITH MYOSURE: SHX6511

## 2019-12-05 LAB — ABO/RH: ABO/RH(D): A POS

## 2019-12-05 SURGERY — DILATATION & CURETTAGE/HYSTEROSCOPY WITH MYOSURE
Anesthesia: General | Site: Uterus

## 2019-12-05 MED ORDER — LIDOCAINE HCL (CARDIAC) PF 100 MG/5ML IV SOSY
PREFILLED_SYRINGE | INTRAVENOUS | Status: DC | PRN
  Administered 2019-12-05: 60 mg via INTRAVENOUS

## 2019-12-05 MED ORDER — LACTATED RINGERS IV SOLN: INTRAVENOUS | Status: DC

## 2019-12-05 MED ORDER — ONDANSETRON HCL 4 MG/2ML IJ SOLN: 4.0000 mg | Freq: Once | INTRAMUSCULAR | Status: DC | PRN

## 2019-12-05 MED ORDER — HYDROCODONE-ACETAMINOPHEN 5-325 MG PO TABS: 1.0000 | ORAL_TABLET | ORAL | Status: DC | PRN

## 2019-12-05 MED ORDER — MIDAZOLAM HCL 2 MG/2ML IJ SOLN
INTRAMUSCULAR | Status: AC
  Filled 2019-12-05: qty 2

## 2019-12-05 MED ORDER — ORAL CARE MOUTH RINSE: 15.0000 mL | Freq: Once | OROMUCOSAL | Status: AC

## 2019-12-05 MED ORDER — LACTATED RINGERS IR SOLN
Status: DC | PRN
  Administered 2019-12-05: 3000 mL

## 2019-12-05 MED ORDER — ONDANSETRON 4 MG PO TBDP: 4.0000 mg | ORAL_TABLET | Freq: Four times a day (QID) | ORAL | Status: DC | PRN

## 2019-12-05 MED ORDER — FENTANYL CITRATE (PF) 100 MCG/2ML IJ SOLN
INTRAMUSCULAR | Status: AC
  Filled 2019-12-05: qty 2

## 2019-12-05 MED ORDER — POVIDONE-IODINE 10 % EX SWAB: 2.0000 "application " | Freq: Once | CUTANEOUS | Status: DC

## 2019-12-05 MED ORDER — MIDAZOLAM HCL 2 MG/2ML IJ SOLN
INTRAMUSCULAR | Status: DC | PRN
  Administered 2019-12-05: 2 mg via INTRAVENOUS

## 2019-12-05 MED ORDER — ONDANSETRON HCL 4 MG/2ML IJ SOLN
INTRAMUSCULAR | Status: AC
  Filled 2019-12-05: qty 2

## 2019-12-05 MED ORDER — LIDOCAINE HCL (PF) 2 % IJ SOLN
INTRAMUSCULAR | Status: AC
  Filled 2019-12-05: qty 5

## 2019-12-05 MED ORDER — CEFAZOLIN SODIUM-DEXTROSE 2-4 GM/100ML-% IV SOLN
2.0000 g | Freq: Once | INTRAVENOUS | Status: AC
  Administered 2019-12-05: 2 g via INTRAVENOUS

## 2019-12-05 MED ORDER — KETOROLAC TROMETHAMINE 30 MG/ML IJ SOLN
INTRAMUSCULAR | Status: AC
  Filled 2019-12-05: qty 1

## 2019-12-05 MED ORDER — FENTANYL CITRATE (PF) 100 MCG/2ML IJ SOLN
INTRAMUSCULAR | Status: DC | PRN
  Administered 2019-12-05: 50 ug via INTRAVENOUS

## 2019-12-05 MED ORDER — CHLORHEXIDINE GLUCONATE 0.12 % MT SOLN: 15.0000 mL | Freq: Once | OROMUCOSAL | Status: AC

## 2019-12-05 MED ORDER — PROPOFOL 10 MG/ML IV BOLUS
INTRAVENOUS | Status: DC | PRN
  Administered 2019-12-05: 160 mg via INTRAVENOUS

## 2019-12-05 MED ORDER — ONDANSETRON HCL 4 MG/2ML IJ SOLN
INTRAMUSCULAR | Status: DC | PRN
  Administered 2019-12-05: 4 mg via INTRAVENOUS

## 2019-12-05 MED ORDER — ACETAMINOPHEN 500 MG PO TABS
ORAL_TABLET | ORAL | Status: AC
  Administered 2019-12-05: 1000 mg via ORAL
  Filled 2019-12-05: qty 2

## 2019-12-05 MED ORDER — GABAPENTIN 300 MG PO CAPS
ORAL_CAPSULE | ORAL | Status: AC
  Administered 2019-12-05: 300 mg via ORAL
  Filled 2019-12-05: qty 1

## 2019-12-05 MED ORDER — ACETAMINOPHEN 500 MG PO TABS: 1000.0000 mg | ORAL_TABLET | ORAL | Status: AC

## 2019-12-05 MED ORDER — FENTANYL CITRATE (PF) 100 MCG/2ML IJ SOLN
25.0000 ug | INTRAMUSCULAR | Status: DC | PRN
  Administered 2019-12-05 (×3): 25 ug via INTRAVENOUS

## 2019-12-05 MED ORDER — FENTANYL CITRATE (PF) 100 MCG/2ML IJ SOLN
INTRAMUSCULAR | Status: AC
  Administered 2019-12-05: 25 ug via INTRAVENOUS
  Filled 2019-12-05: qty 2

## 2019-12-05 MED ORDER — PROPOFOL 10 MG/ML IV BOLUS
INTRAVENOUS | Status: AC
  Filled 2019-12-05: qty 40

## 2019-12-05 MED ORDER — DEXAMETHASONE SODIUM PHOSPHATE 10 MG/ML IJ SOLN
INTRAMUSCULAR | Status: DC | PRN
  Administered 2019-12-05: 10 mg via INTRAVENOUS

## 2019-12-05 MED ORDER — CHLORHEXIDINE GLUCONATE 0.12 % MT SOLN
OROMUCOSAL | Status: AC
  Administered 2019-12-05: 15 mL via OROMUCOSAL
  Filled 2019-12-05: qty 15

## 2019-12-05 MED ORDER — GABAPENTIN 300 MG PO CAPS: 300.0000 mg | ORAL_CAPSULE | ORAL | Status: AC

## 2019-12-05 MED ORDER — CEFAZOLIN SODIUM-DEXTROSE 2-4 GM/100ML-% IV SOLN
INTRAVENOUS | Status: AC
  Filled 2019-12-05: qty 100

## 2019-12-05 MED ORDER — DEXAMETHASONE SODIUM PHOSPHATE 10 MG/ML IJ SOLN
INTRAMUSCULAR | Status: AC
  Filled 2019-12-05: qty 1

## 2019-12-05 SURGICAL SUPPLY — 24 items
BAG INFUSER PRESSURE 100CC (MISCELLANEOUS) ×2 IMPLANT
CANISTER SUCT 3000ML PPV (MISCELLANEOUS) ×2 IMPLANT
CATH ROBINSON RED A/P 16FR (CATHETERS) ×2 IMPLANT
COVER WAND RF STERILE (DRAPES) IMPLANT
DEVICE MYOSURE LITE (MISCELLANEOUS) IMPLANT
DEVICE MYOSURE REACH (MISCELLANEOUS) ×2 IMPLANT
GLOVE SURG SYN 8.0 (GLOVE) ×2 IMPLANT
GOWN STRL REUS W/ TWL LRG LVL3 (GOWN DISPOSABLE) ×1 IMPLANT
GOWN STRL REUS W/ TWL XL LVL3 (GOWN DISPOSABLE) ×1 IMPLANT
GOWN STRL REUS W/TWL LRG LVL3 (GOWN DISPOSABLE) ×2
GOWN STRL REUS W/TWL XL LVL3 (GOWN DISPOSABLE) ×2
IV NS 1000ML (IV SOLUTION) ×2
IV NS 1000ML BAXH (IV SOLUTION) ×1 IMPLANT
KIT PROCEDURE FLUENT (KITS) ×2 IMPLANT
KIT TURNOVER CYSTO (KITS) ×2 IMPLANT
PACK DNC HYST (MISCELLANEOUS) IMPLANT
PAD OB MATERNITY 4.3X12.25 (PERSONAL CARE ITEMS) ×2 IMPLANT
PAD PREP 24X41 OB/GYN DISP (PERSONAL CARE ITEMS) ×2 IMPLANT
SEAL ROD LENS SCOPE MYOSURE (ABLATOR) ×2 IMPLANT
SOL .9 NS 3000ML IRR  AL (IV SOLUTION) ×1
SOL .9 NS 3000ML IRR AL (IV SOLUTION) ×1
SOL .9 NS 3000ML IRR UROMATIC (IV SOLUTION) ×1 IMPLANT
TOWEL OR 17X26 4PK STRL BLUE (TOWEL DISPOSABLE) ×4 IMPLANT
TUBING CONNECTING 10 (TUBING) IMPLANT

## 2019-12-05 NOTE — Progress Notes (Signed)
Ready for Fx D+C , H/S and possible myosure  Labs reviewed all questions answered  

## 2019-12-05 NOTE — Op Note (Signed)
NAME: Stephanie Cooper, Stephanie Cooper MEDICAL RECORD [id number] ACCOUNT [id number] DATE OF BIRTH:05-Nov-1955 FACILITY: [facility] LOCATION: [facility] PHYSICIAN:Brigitte Soderberg Cloyde Reams, MD  OPERATIVE REPORT  DATE OF PROCEDURE:  12/05/2019  PREOPERATIVE DIAGNOSES: 1.  Postmenopausal bleeding. 2.  Thickened endometrial stripe.  POSTOPERATIVE DIAGNOSES:   1.  Postmenopausal bleeding. 2.  A 2 x 2 cm endometrial polyp.  PROCEDURE: 1.  Fractional dilation and curettage. 2.  MyoSure resection of endometrial polyp with hysteroscopy support.  SURGEON:  Jennell Corner, MD  ANESTHESIA:  General endotracheal anesthesia.  INDICATIONS:  A 64 year old female with postmenopausal bleeding.  The patient's endometrial cavity could not be sampled in the office.  Ultrasound showed a 2 cm thickened endometrial stripe.  DESCRIPTION OF PROCEDURE:  After adequate general endotracheal anesthesia, the patient was placed in dorsal supine position, legs in the candy cane stirrups.  The lower abdomen, perineum and vagina were prepped and draped in normal sterile fashion.  The  patient did receive 2 grams IV Ancef for surgical prophylaxis.  Timeout was performed.  A weighted speculum was placed in the posterior vaginal vault after draining the bladder, which yielded 150 mL clear urine.  The anterior cervix was grasped with a  single tooth tenaculum.  The cervix was then dilated to #12 Hanks dilator without difficulty.  Endocervical curettage was performed.  Uterine sounding sounded to 8.5 cm.  Cervix was then dilated to #17 Hanks dilator.  Hysteroscope was advanced into the  endometrial cavity and demonstrated a large endometrial polyp approximately 2 x 2 cm.  The MyoSure resector was brought up to the operative field and was placed into the endometrial cavity and resection of the endometrial polyp occurred for approximately  55 seconds with total resection of the polyp.  Pictures were taken.  Good hemostasis was noted.   All instruments were removed.  INTRAOPERATIVE FLUIDS:  400 mL.    BLOOD LOSS:  5 mL.  URINE OUTPUT:  150 mL.  DISPOSITION:  The patient was taken to recovery room in good condition.  CN/NUANCE  D:12/05/2019 T:12/05/2019 JOB:[id number]

## 2019-12-05 NOTE — Transfer of Care (Signed)
Immediate Anesthesia Transfer of Care Note  Patient: Stephanie Cooper  Procedure(s) Performed: FRACTIONAL DILATATION & CURETTAGE/HYSTEROSCOPY WITH MYOSURE (N/A Uterus)  Patient Location: PACU  Anesthesia Type:General  Level of Consciousness: drowsy  Airway & Oxygen Therapy: Patient Spontanous Breathing and Patient connected to face mask oxygen  Post-op Assessment: Report given to RN  Post vital signs: stable  Last Vitals:  Vitals Value Taken Time  BP    Temp    Pulse 69 12/05/19 1230  Resp 8 12/05/19 1230  SpO2 100 % 12/05/19 1230  Vitals shown include unvalidated device data.  Last Pain:  Vitals:   12/05/19 0956  TempSrc: Temporal  PainSc: 0-No pain         Complications: No complications documented.

## 2019-12-05 NOTE — Anesthesia Preprocedure Evaluation (Addendum)
Anesthesia Evaluation  Patient identified by MRN, date of birth, ID band Patient awake    Reviewed: Allergy & Precautions, NPO status , Patient's Chart, lab work & pertinent test results  History of Anesthesia Complications (+) PONV and history of anesthetic complications  Airway Mallampati: III       Dental   Pulmonary neg sleep apnea, neg COPD, Not current smoker,           Cardiovascular hypertension, Pt. on medications (-) Past MI and (-) CHF (-) dysrhythmias (-) Valvular Problems/Murmurs     Neuro/Psych neg Seizures    GI/Hepatic Neg liver ROS, GERD  Medicated and Controlled,  Endo/Other  neg diabetes  Renal/GU negative Renal ROS     Musculoskeletal   Abdominal   Peds  Hematology  (+) anemia ,   Anesthesia Other Findings   Reproductive/Obstetrics                            Anesthesia Physical Anesthesia Plan  ASA: III  Anesthesia Plan: General   Post-op Pain Management:    Induction: Intravenous  PONV Risk Score and Plan: 4 or greater and Ondansetron, Dexamethasone and Treatment may vary due to age or medical condition  Airway Management Planned: LMA  Additional Equipment:   Intra-op Plan:   Post-operative Plan:   Informed Consent: I have reviewed the patients History and Physical, chart, labs and discussed the procedure including the risks, benefits and alternatives for the proposed anesthesia with the patient or authorized representative who has indicated his/her understanding and acceptance.       Plan Discussed with:   Anesthesia Plan Comments:         Anesthesia Quick Evaluation

## 2019-12-05 NOTE — Anesthesia Procedure Notes (Signed)
Procedure Name: LMA Insertion Date/Time: 12/05/2019 11:38 AM Performed by: Manning Charity, CRNA Pre-anesthesia Checklist: Patient identified, Emergency Drugs available, Suction available and Patient being monitored Patient Re-evaluated:Patient Re-evaluated prior to induction Oxygen Delivery Method: Circle system utilized Preoxygenation: Pre-oxygenation with 100% oxygen Induction Type: IV induction Ventilation: Mask ventilation without difficulty LMA Size: 3.0 Number of attempts: 1 Placement Confirmation: positive ETCO2 and breath sounds checked- equal and bilateral Tube secured with: Tape Dental Injury: Teeth and Oropharynx as per pre-operative assessment

## 2019-12-05 NOTE — Progress Notes (Signed)
Ready for Fx D+C , H/S and possible myosure  Labs reviewed all questions answered

## 2019-12-05 NOTE — Brief Op Note (Signed)
12/05/2019  12:21 PM  PATIENT:  Stephanie Cooper  64 y.o. female  PRE-OPERATIVE DIAGNOSIS:  postmenopausal bleeding, thickened stripe  POST-OPERATIVE DIAGNOSIS:  postmenopausal bleeding, thickened stripe  PROCEDURE:  Procedure(s): FRACTIONAL DILATATION & CURETTAGE/HYSTEROSCOPY WITH MYOSURE (N/A)  SURGEON:  Surgeon(s) and Role:    * Reda Citron, Ihor Austin, MD - Primary  PHYSICIAN ASSISTANT: n/a  ASSISTANTS: none   ANESTHESIA:   general  EBL:  5 mL IOF 40 cc ua 150 cc  BLOOD ADMINISTERED:none  DRAINS: none   LOCAL MEDICATIONS USED:  NONE  SPECIMEN:  Source of Specimen:  ecc, endometrial polyp   DISPOSITION OF SPECIMEN:  PATHOLOGY  COUNTS:  YES  TOURNIQUET:  * No tourniquets in log *  DICTATION: .Other Dictation: Dictation Number verbal  PLAN OF CARE: Discharge to home after PACU  PATIENT DISPOSITION:  PACU - hemodynamically stable.   Delay start of Pharmacological VTE agent (>24hrs) due to surgical blood loss or risk of bleeding: not applicable

## 2019-12-05 NOTE — Anesthesia Postprocedure Evaluation (Signed)
Anesthesia Post Note  Patient: BRYN SALINE  Procedure(s) Performed: FRACTIONAL DILATATION & CURETTAGE/HYSTEROSCOPY WITH MYOSURE (N/A Uterus)  Patient location during evaluation: PACU Anesthesia Type: General Level of consciousness: awake and alert and oriented Pain management: pain level controlled Vital Signs Assessment: post-procedure vital signs reviewed and stable Respiratory status: spontaneous breathing, nonlabored ventilation and respiratory function stable Cardiovascular status: blood pressure returned to baseline and stable Postop Assessment: no signs of nausea or vomiting Anesthetic complications: no   No complications documented.   Last Vitals:  Vitals:   12/05/19 1320 12/05/19 1331  BP: (!) 132/46 (!) 153/54  Pulse: 60 67  Resp: 15 18  Temp:  36.4 C  SpO2: 95% 99%    Last Pain:  Vitals:   12/05/19 1331  TempSrc: Tympanic  PainSc: 0-No pain                 Treyvion Durkee

## 2019-12-05 NOTE — Discharge Instructions (Signed)
AMBULATORY SURGERY  °DISCHARGE INSTRUCTIONS ° ° °1) The drugs that you were given will stay in your system until tomorrow so for the next 24 hours you should not: ° °A) Drive an automobile °B) Make any legal decisions °C) Drink any alcoholic beverage ° ° °2) You may resume regular meals tomorrow.  Today it is better to start with liquids and gradually work up to solid foods. ° °You may eat anything you prefer, but it is better to start with liquids, then soup and crackers, and gradually work up to solid foods. ° ° °3) Please notify your doctor immediately if you have any unusual bleeding, trouble breathing, redness and pain at the surgery site, drainage, fever, or pain not relieved by medication. ° ° ° °4) Additional Instructions: ° ° ° ° ° ° ° °Please contact your physician with any problems or Same Day Surgery at [phone], Monday through Friday 6 am to 4 pm, or [place] [facility] [place] Main number at [phone]. °

## 2019-12-06 ENCOUNTER — Encounter: Payer: Self-pay | Admitting: Obstetrics and Gynecology

## 2019-12-09 LAB — SURGICAL PATHOLOGY

## 2020-12-08 ENCOUNTER — Other Ambulatory Visit: Payer: Self-pay | Admitting: Internal Medicine

## 2020-12-08 DIAGNOSIS — Z1231 Encounter for screening mammogram for malignant neoplasm of breast: Secondary | ICD-10-CM

## 2020-12-14 ENCOUNTER — Ambulatory Visit
Admission: RE | Admit: 2020-12-14 | Discharge: 2020-12-14 | Disposition: A | Discharge status: Home / Self Care | Payer: BC Managed Care – PPO | Source: Ambulatory Visit | Attending: Internal Medicine | Admitting: Internal Medicine

## 2020-12-14 ENCOUNTER — Other Ambulatory Visit: Payer: Self-pay

## 2020-12-14 DIAGNOSIS — Z1231 Encounter for screening mammogram for malignant neoplasm of breast: Secondary | ICD-10-CM | POA: Diagnosis not present

## 2021-03-27 ENCOUNTER — Encounter: Payer: Self-pay | Admitting: Internal Medicine

## 2021-03-27 ENCOUNTER — Observation Stay
Admission: EM | Admit: 2021-03-27 | Discharge: 2021-03-28 | Disposition: A | Discharge status: Home / Self Care | Payer: BC Managed Care – PPO | Attending: Internal Medicine | Admitting: Internal Medicine

## 2021-03-27 ENCOUNTER — Emergency Department: Payer: BC Managed Care – PPO

## 2021-03-27 ENCOUNTER — Other Ambulatory Visit: Payer: Self-pay

## 2021-03-27 DIAGNOSIS — Z7982 Long term (current) use of aspirin: Secondary | ICD-10-CM | POA: Insufficient documentation

## 2021-03-27 DIAGNOSIS — I214 Non-ST elevation (NSTEMI) myocardial infarction: Secondary | ICD-10-CM

## 2021-03-27 DIAGNOSIS — R778 Other specified abnormalities of plasma proteins: Secondary | ICD-10-CM

## 2021-03-27 DIAGNOSIS — Z79899 Other long term (current) drug therapy: Secondary | ICD-10-CM | POA: Diagnosis not present

## 2021-03-27 DIAGNOSIS — Z20822 Contact with and (suspected) exposure to covid-19: Secondary | ICD-10-CM | POA: Insufficient documentation

## 2021-03-27 DIAGNOSIS — I421 Obstructive hypertrophic cardiomyopathy: Secondary | ICD-10-CM

## 2021-03-27 DIAGNOSIS — I110 Hypertensive heart disease with heart failure: Secondary | ICD-10-CM | POA: Insufficient documentation

## 2021-03-27 DIAGNOSIS — I5032 Chronic diastolic (congestive) heart failure: Secondary | ICD-10-CM | POA: Diagnosis not present

## 2021-03-27 DIAGNOSIS — I10 Essential (primary) hypertension: Secondary | ICD-10-CM

## 2021-03-27 DIAGNOSIS — K219 Gastro-esophageal reflux disease without esophagitis: Secondary | ICD-10-CM | POA: Diagnosis not present

## 2021-03-27 DIAGNOSIS — I480 Paroxysmal atrial fibrillation: Secondary | ICD-10-CM

## 2021-03-27 DIAGNOSIS — R002 Palpitations: Secondary | ICD-10-CM

## 2021-03-27 DIAGNOSIS — I340 Nonrheumatic mitral (valve) insufficiency: Secondary | ICD-10-CM

## 2021-03-27 DIAGNOSIS — R079 Chest pain, unspecified: Secondary | ICD-10-CM | POA: Diagnosis present

## 2021-03-27 HISTORY — DX: Non-ST elevation (NSTEMI) myocardial infarction: I21.4

## 2021-03-27 LAB — HEPARIN LEVEL (UNFRACTIONATED)
Heparin Unfractionated: 0.35 IU/mL (ref 0.30–0.70)
Heparin Unfractionated: 0.33 IU/mL (ref 0.30–0.70)

## 2021-03-27 LAB — CBC
HCT: 38.4 % (ref 36.0–46.0)
Hemoglobin: 12.5 g/dL (ref 12.0–15.0)
MCH: 28.1 pg (ref 26.0–34.0)
MCHC: 32.6 g/dL (ref 30.0–36.0)
MCV: 86.3 fL (ref 80.0–100.0)
Platelets: 279 10*3/uL (ref 150–400)
RBC: 4.45 MIL/uL (ref 3.87–5.11)
RDW: 14.4 % (ref 11.5–15.5)
WBC: 7.2 10*3/uL (ref 4.0–10.5)
nRBC: 0 % (ref 0.0–0.2)

## 2021-03-27 LAB — RESP PANEL BY RT-PCR (FLU A&B, COVID) ARPGX2
Influenza A by PCR: NEGATIVE
Influenza B by PCR: NEGATIVE
SARS Coronavirus 2 by RT PCR: NEGATIVE

## 2021-03-27 LAB — COMPREHENSIVE METABOLIC PANEL
ALT: 13 U/L (ref 0–44)
AST: 21 U/L (ref 15–41)
Albumin: 3.8 g/dL (ref 3.5–5.0)
Alkaline Phosphatase: 57 U/L (ref 38–126)
Anion gap: 5 (ref 5–15)
BUN: 20 mg/dL (ref 8–23)
CO2: 28 mmol/L (ref 22–32)
Calcium: 9.6 mg/dL (ref 8.9–10.3)
Chloride: 106 mmol/L (ref 98–111)
Creatinine, Ser: 0.95 mg/dL (ref 0.44–1.00)
GFR, Estimated: >60 mL/min (ref >60)
Glucose, Bld: 163 mg/dL — ABNORMAL HIGH (ref 70–99)
Potassium: 3.6 mmol/L (ref 3.5–5.1)
Sodium: 139 mmol/L (ref 135–145)
Total Bilirubin: 0.8 mg/dL (ref 0.3–1.2)
Total Protein: 7.3 g/dL (ref 6.5–8.1)

## 2021-03-27 LAB — HIV ANTIBODY (ROUTINE TESTING W REFLEX): HIV Screen 4th Generation wRfx: NONREACTIVE

## 2021-03-27 LAB — T4, FREE: Free T4: 0.8 ng/dL (ref 0.61–1.12)

## 2021-03-27 LAB — APTT: aPTT: 98 seconds — ABNORMAL HIGH (ref 24–36)

## 2021-03-27 LAB — TSH: TSH: 3.498 u[IU]/mL (ref 0.350–4.500)

## 2021-03-27 LAB — TROPONIN I (HIGH SENSITIVITY)
Troponin I (High Sensitivity): 36 ng/L — ABNORMAL HIGH (ref <18)
Troponin I (High Sensitivity): 89 ng/L — ABNORMAL HIGH (ref <18)
Troponin I (High Sensitivity): 199 ng/L (ref <18)
Troponin I (High Sensitivity): 231 ng/L (ref <18)
Troponin I (High Sensitivity): 212 ng/L (ref <18)

## 2021-03-27 LAB — PROTIME-INR
INR: 1.1 (ref 0.8–1.2)
Prothrombin Time: 13.7 seconds (ref 11.4–15.2)

## 2021-03-27 LAB — MAGNESIUM: Magnesium: 1.9 mg/dL (ref 1.7–2.4)

## 2021-03-27 MED ORDER — SODIUM CHLORIDE 0.9% FLUSH: 3.0000 mL | Freq: Two times a day (BID) | INTRAVENOUS | Status: DC

## 2021-03-27 MED ORDER — OYSTER SHELL CALCIUM/D3 500-5 MG-MCG PO TABS: 1.0000 | ORAL_TABLET | Freq: Every day | ORAL | Status: DC

## 2021-03-27 MED ORDER — ASPIRIN EC 81 MG PO TBEC: 81.0000 mg | DELAYED_RELEASE_TABLET | Freq: Every day | ORAL | Status: DC

## 2021-03-27 MED ORDER — HEPARIN (PORCINE) 25000 UT/250ML-% IV SOLN
1100.0000 [IU]/h | INTRAVENOUS | Status: DC
Start: 2021-03-27 — End: 2021-03-28
  Administered 2021-03-27 – 2021-03-28 (×2): 1100 [IU]/h via INTRAVENOUS
  Filled 2021-03-27 (×2): qty 250

## 2021-03-27 MED ORDER — FAMOTIDINE 20 MG PO TABS
20.0000 mg | ORAL_TABLET | Freq: Two times a day (BID) | ORAL | Status: DC
  Administered 2021-03-27: 20 mg via ORAL
  Filled 2021-03-27: qty 1

## 2021-03-27 MED ORDER — METOPROLOL SUCCINATE ER 25 MG PO TB24
12.5000 mg | ORAL_TABLET | Freq: Every day | ORAL | Status: DC
  Administered 2021-03-27: 12.5 mg via ORAL
  Filled 2021-03-27 (×2): qty 0.5

## 2021-03-27 MED ORDER — ACETAMINOPHEN 325 MG PO TABS: 650.0000 mg | ORAL_TABLET | ORAL | Status: DC | PRN

## 2021-03-27 MED ORDER — LOSARTAN POTASSIUM 50 MG PO TABS: 25.0000 mg | ORAL_TABLET | Freq: Every day | ORAL | Status: DC

## 2021-03-27 MED ORDER — IRON-VITAMIN C 65-125 MG PO TABS: ORAL_TABLET | Freq: Every day | ORAL | Status: DC

## 2021-03-27 MED ORDER — ATORVASTATIN CALCIUM 20 MG PO TABS
40.0000 mg | ORAL_TABLET | Freq: Every day | ORAL | Status: DC
  Filled 2021-03-27: qty 2

## 2021-03-27 MED ORDER — ASPIRIN 81 MG PO CHEW
324.0000 mg | CHEWABLE_TABLET | Freq: Once | ORAL | Status: AC
  Administered 2021-03-27: 324 mg via ORAL
  Filled 2021-03-27: qty 4

## 2021-03-27 MED ORDER — HEPARIN BOLUS VIA INFUSION
4000.0000 [IU] | Freq: Once | INTRAVENOUS | Status: AC
  Administered 2021-03-27: 4000 [IU] via INTRAVENOUS
  Filled 2021-03-27: qty 4000

## 2021-03-27 MED ORDER — NITROGLYCERIN 0.4 MG SL SUBL: 0.4000 mg | SUBLINGUAL_TABLET | SUBLINGUAL | Status: DC | PRN

## 2021-03-27 MED ORDER — MONTELUKAST SODIUM 10 MG PO TABS
10.0000 mg | ORAL_TABLET | Freq: Every evening | ORAL | Status: DC
  Administered 2021-03-27: 10 mg via ORAL
  Filled 2021-03-27: qty 1

## 2021-03-27 MED ORDER — ONDANSETRON HCL 4 MG/2ML IJ SOLN: 4.0000 mg | Freq: Four times a day (QID) | INTRAMUSCULAR | Status: DC | PRN

## 2021-03-27 NOTE — Consult Note (Signed)
ANTICOAGULATION CONSULT NOTE   Pharmacy Consult for Heparin Indication: chest pain/ACS  Allergies  Allergen Reactions   Lisinopril Cough   Prednisone Other (See Comments)    Elevated blood pressure Felt like weight on her chest    Patient Measurements: Height: 5\' 7"  (170.2 cm) Weight: 90.7 kg (200 lb) IBW/kg (Calculated) : 61.6 Heparin Dosing Weight: 81.1 kg  Vital Signs: BP: 108/54 (11/13 1500) Pulse Rate: 67 (11/13 1500)  Labs: Recent Labs    03/27/21 0235 03/27/21 0446 03/27/21 0907 03/27/21 1031 03/27/21 1403 03/27/21 1700  HGB 12.5  --   --   --   --   --   HCT 38.4  --   --   --   --   --   PLT 279  --   --   --   --   --   APTT  --   --   --  98*  --   --   LABPROT  --   --   --  13.7  --   --   INR  --   --   --  1.1  --   --   HEPARINUNFRC  --   --   --   --   --  0.35  CREATININE 0.95  --   --   --   --   --   TROPONINIHS 36*   < > 199* 231* 212*  --    < > = values in this interval not displayed.     Estimated Creatinine Clearance: 66.4 mL/min (by C-G formula based on SCr of 0.95 mg/dL).   Medical History: Past Medical History:  Diagnosis Date   Anemia    Chronic diastolic (congestive) heart failure (HCC) 2017   GRADE 2-saw [place] in 2020-he states for pt to just f/u as needed   Complication of anesthesia    Family history of adverse reaction to anesthesia    DAUGHTER-N/V   GERD (gastroesophageal reflux disease)    History of kidney stones    H/O   Hypertension    PONV (postoperative nausea and vomiting)    N/V   Scoliosis    Vertigo 2012   X1    Medications:  (Not in a hospital admission) Scheduled:  Infusions:  PRN:  Anti-infectives (From admission, onward)    None       Assessment: Pharmacy consulted to start heparin for ACS. Troponin elevated. Denies chest pain. No DOAC PTA. Baseline labs ordered.   11/13 1700 HL 0.35   Goal of Therapy:  Heparin level 0.3-0.7 units/ml Monitor platelets by anticoagulation protocol:  Yes   Plan:  Heparin level is therapeutic. Will continue heparin infusion at 1100 units/hr. Recheck heparin level in 6 hours. CBC daily while on heparin.   12/13, PharmD, BCPS 03/27/2021,5:29 PM

## 2021-03-27 NOTE — Consult Note (Signed)
ANTICOAGULATION CONSULT NOTE   Pharmacy Consult for Heparin Indication: chest pain/ACS  Allergies  Allergen Reactions   Lisinopril Cough   Prednisone Other (See Comments)    Elevated blood pressure Felt like weight on her chest    Patient Measurements: Height: 5\' 7"  (170.2 cm) Weight: 90.7 kg (200 lb) IBW/kg (Calculated) : 61.6 Heparin Dosing Weight: 81.1 kg  Vital Signs: Temp: 98 F (36.7 C) (11/13 0238) Temp Source: Oral (11/13 0238) BP: 131/61 (11/13 0930) Pulse Rate: 60 (11/13 0930)  Labs: Recent Labs    03/27/21 0235 03/27/21 0446 03/27/21 0907  HGB 12.5  --   --   HCT 38.4  --   --   PLT 279  --   --   CREATININE 0.95  --   --   TROPONINIHS 36* 89* 199*    Estimated Creatinine Clearance: 66.4 mL/min (by C-G formula based on SCr of 0.95 mg/dL).   Medical History: Past Medical History:  Diagnosis Date   Anemia    Chronic diastolic (congestive) heart failure (HCC) 2017   GRADE 2-saw [place] in 2020-he states for pt to just f/u as needed   Complication of anesthesia    Family history of adverse reaction to anesthesia    DAUGHTER-N/V   GERD (gastroesophageal reflux disease)    History of kidney stones    H/O   Hypertension    PONV (postoperative nausea and vomiting)    N/V   Scoliosis    Vertigo 2012   X1    Medications:  (Not in a hospital admission)  Scheduled:  Infusions:  PRN:  Anti-infectives (From admission, onward)    None       Assessment: Pharmacy consulted to start heparin for ACS. Troponin elevated. Denies chest pain. No DOAC PTA. Baseline labs ordered.   Goal of Therapy:  Heparin level 0.3-0.7 units/ml Monitor platelets by anticoagulation protocol: Yes   Plan:  Give 4000 units bolus x 1 Start heparin infusion at 1100 units/hr Check anti-Xa level in 6 hours and daily while on heparin Continue to monitor H&H and platelets  2013, PharmD, BCPS 03/27/2021,9:57 AM

## 2021-03-27 NOTE — ED Provider Notes (Addendum)
[facility] Emergency Department Provider Note   ____________________________________________   Event Date/Time   First MD Initiated Contact with Patient 03/27/21 [phone]     (approximate)  I have reviewed the triage vital signs and the nursing notes.   HISTORY  Chief Complaint Tachycardia and Chest Pain    HPI Stephanie Cooper is a 65 y.o. female with past medical history of hypertension, hyperlipidemia, diastolic CHF, and GERD who presents to the ED complaining of palpitations.  Patient reports that she was woken from sleep around midnight with sensation that her heart was racing.  This was associated with dull ache in the center of her chest that did not radiate anywhere.  She denies any associated shortness of breath, lightheadedness, or dizziness.  She has never had similar symptoms in the past, but states her apple watch and iPhone told her that she was in atrial fibrillation.  Palpitations resolved on the way to the emergency department and she now denies any ongoing chest discomfort.  She denies any history of thyroid problems, has been feeling well recently with no fevers, cough, vomiting, diarrhea, or dysuria.  She denies any significant cardiac history.        Past Medical History:  Diagnosis Date   Anemia    Chronic diastolic (congestive) heart failure (HCC) 2017   GRADE 2-saw [place] in 2020-he states for pt to just f/u as needed   Complication of anesthesia    Family history of adverse reaction to anesthesia    DAUGHTER-N/V   GERD (gastroesophageal reflux disease)    History of kidney stones    H/O   Hypertension    PONV (postoperative nausea and vomiting)    N/V   Scoliosis    Vertigo 2012   X1    Patient Active Problem List   Diagnosis Date Noted   NSTEMI (non-ST elevated myocardial infarction) (HCC) 03/27/2021   Angina at rest [facility]) 08/18/2015    Past Surgical History:  Procedure Laterality Date   DIAGNOSTIC LAPAROSCOPY   1990   DILATATION & CURETTAGE/HYSTEROSCOPY WITH MYOSURE N/A 12/05/2019   Procedure: FRACTIONAL DILATATION & CURETTAGE/HYSTEROSCOPY WITH MYOSURE;  Surgeon: Schermerhorn, Ihor Austin, MD;  Location: [facility] ORS;  Service: Gynecology;  Laterality: N/A;   LITHOTRIPSY      Prior to Admission medications   Medication Sig Start Date End Date Taking? Authorizing Provider  alendronate (FOSAMAX) 70 MG tablet Take 70 mg by mouth once a week. Take with a full glass of water on an empty stomach.    [provider]  aspirin EC 81 MG tablet Take 1 tablet (81 mg total) by mouth daily. Patient not taking: Reported on 11/20/2019 08/19/15   Altamese Dilling, MD  atorvastatin (LIPITOR) 40 MG tablet Take 1 tablet (40 mg total) by mouth daily. Patient not taking: Reported on 11/20/2019 08/19/15   Altamese Dilling, MD  Calcium Carbonate-Vitamin D 600-400 MG-UNIT tablet Take 1 tablet by mouth daily with breakfast.     [provider]  famotidine (PEPCID) 20 MG tablet Take 20 mg by mouth 2 (two) times daily. 10/11/19   [provider]  Iron-Vitamin C (VITRON-C PO) Take 1 tablet by mouth daily.    [provider]  lisinopril (PRINIVIL,ZESTRIL) 5 MG tablet Take 1 tablet (5 mg total) by mouth daily. Patient not taking: Reported on 11/20/2019 08/19/15   Altamese Dilling, MD  losartan (COZAAR) 25 MG tablet Take 25 mg by mouth every morning.  10/31/19   [provider]  metoprolol succinate (TOPROL XL) 25 MG 24 hr tablet Take 1 tablet (25 mg total) by mouth daily. Patient not taking: Reported on 11/20/2019 08/19/15   Altamese Dilling, MD  montelukast (SINGULAIR) 10 MG tablet Take 10 mg by mouth every evening.  10/07/19   [provider]    Allergies Lisinopril and Prednisone  Family History  Problem Relation Age of Onset   Diverticulitis Mother    Diabetes Father    Breast cancer Neg Hx     Social History Social History   Tobacco Use   Smoking status: Never    Smokeless tobacco: Never  Substance Use Topics   Alcohol use: Yes    Comment: OCC BEER OR WINE   Drug use: Never    Review of Systems  Constitutional: No fever/chills Eyes: No visual changes. ENT: No sore throat. Cardiovascular: Positive for palpitations and chest pain. Respiratory: Denies shortness of breath. Gastrointestinal: No abdominal pain.  No nausea, no vomiting.  No diarrhea.  No constipation. Genitourinary: Negative for dysuria. Musculoskeletal: Negative for back pain. Skin: Negative for rash. Neurological: Negative for headaches, focal weakness or numbness.  ____________________________________________   PHYSICAL EXAM:  VITAL SIGNS: ED Triage Vitals  Enc Vitals Group     BP 03/27/21 0238 (!) 147/66     Pulse Rate 03/27/21 0233 87     Resp 03/27/21 0233 16     Temp 03/27/21 0238 98 F (36.7 C)     Temp Source 03/27/21 0233 Oral     SpO2 03/27/21 0238 98 %     Weight 03/27/21 0230 200 lb (90.7 kg)     Height 03/27/21 0230 5\' 7"  (1.702 m)     Head Circumference --      Peak Flow --      Pain Score 03/27/21 0230 0     Pain Loc --      Pain Edu? --      Excl. in GC? --     Constitutional: Alert and oriented. Eyes: Conjunctivae are normal. Head: Atraumatic. Nose: No congestion/rhinnorhea. Mouth/Throat: Mucous membranes are moist. Neck: Normal ROM Cardiovascular: Normal rate, regular rhythm. Grossly normal heart sounds.  2+ radial pulses bilaterally. Respiratory: Normal respiratory effort.  No retractions. Lungs CTAB. Gastrointestinal: Soft and nontender. No distention. Genitourinary: deferred Musculoskeletal: No lower extremity tenderness nor edema. Neurologic:  Normal speech and language. No gross focal neurologic deficits are appreciated. Skin:  Skin is warm, dry and intact. No rash noted. Psychiatric: Mood and affect are normal. Speech and behavior are normal.  ____________________________________________   LABS (all labs ordered are listed,  but only abnormal results are displayed)  Labs Reviewed  COMPREHENSIVE METABOLIC PANEL - Abnormal; Notable for the following components:      Result Value   Glucose, Bld 163 (*)    All other components within normal limits  TROPONIN I (HIGH SENSITIVITY) - Abnormal; Notable for the following components:   Troponin I (High Sensitivity) 36 (*)    All other components within normal limits  TROPONIN I (HIGH SENSITIVITY) - Abnormal; Notable for the following components:   Troponin I (High Sensitivity) 89 (*)    All other components within normal limits  TROPONIN I (HIGH SENSITIVITY) - Abnormal; Notable for the following components:   Troponin I (High Sensitivity) 199 (*)    All other components within normal limits  CBC  TSH  T4, FREE  MAGNESIUM  HEPARIN LEVEL (UNFRACTIONATED)  APTT  PROTIME-INR  HIV ANTIBODY (ROUTINE TESTING W REFLEX)   ____________________________________________  EKG  ED ECG REPORT I, Chesley Noon, the attending physician, personally viewed and interpreted this ECG.   Date: 03/27/2021  EKG Time: 2:31  Rate: 87  Rhythm: normal sinus rhythm  Axis: Normal  Intervals:none  ST&T Change: Nonspecific ST changes   PROCEDURES  Procedure(s) performed (including Critical Care):  .Critical Care Performed by: Chesley Noon, MD Authorized by: Chesley Noon, MD   Critical care provider statement:    Critical care time (minutes):  45   Critical care time was exclusive of:  Separately billable procedures and treating other patients and teaching time   Critical care was necessary to treat or prevent imminent or life-threatening deterioration of the following conditions:  Cardiac failure   Critical care was time spent personally by me on the following activities:  Development of treatment plan with patient or surrogate, discussions with consultants, evaluation of patient's response to treatment, examination of patient, ordering and review of laboratory studies,  ordering and review of radiographic studies, ordering and performing treatments and interventions, pulse oximetry, re-evaluation of patient's condition and review of old charts   I assumed direction of critical care for this patient from another provider in my specialty: no     Care discussed with: admitting provider     ____________________________________________   INITIAL IMPRESSION / ASSESSMENT AND PLAN / ED COURSE      65 year old female with past medical history of hypertension, hyperlipidemia, diastolic CHF, and GERD who presents to the ED complaining of episode of palpitations and chest discomfort lasting for about 2 hours prior to arrival but since resolved.  EKG here in the ED shows normal sinus rhythm with no ischemic changes, however I was able to review rhythm strip from patient's iPhone that appears consistent with atrial fibrillation with RVR.  We will observe on cardiac monitor, labs thus far show no electrolyte abnormality or thyroid dysfunction.  She does have mildly elevated troponin that unfortunately is uptrending on recheck, likely rate related strain from her episode of atrial fibrillation.  We will continue to trend troponin and case discussed with Dr. Gwen Pounds of cardiology.  If troponin remains uptrending, plan for admission to hospitalist service.  Troponin continues to trend upwards to 199, although patient remains asymptomatic at this time.  We will start her on heparin, give dose of aspirin, and admit for further management.      ____________________________________________   FINAL CLINICAL IMPRESSION(S) / ED DIAGNOSES  Final diagnoses:  NSTEMI (non-ST elevated myocardial infarction) [facility])  Palpitations     ED Discharge Orders     None        Note:  This document was prepared using Dragon voice recognition software and may include unintentional dictation errors.    Chesley Noon, MD 03/27/21 1012    Chesley Noon, MD 03/27/21 1013

## 2021-03-27 NOTE — Consult Note (Signed)
[facility] Cardiology Consultation Note  Patient ID: Stephanie Cooper, MRN: [id number], DOB/AGE: 67-08-1955 67 y.o. Admit date: 03/27/2021   Date of Consult: 03/27/2021 Primary Physician: Lynnea Ferrier, MD Primary Cardiologist: [facility]  Chief Complaint:  Chief Complaint  Patient presents with   Tachycardia   Chest Pain   Reason for Consult:  Chest pain patient needed troponin  HPI: 65 y.o. female with apparent left ventricular hypertrophy and previous history of diastolic dysfunction congestive heart failure on appropriate antihypertensive medication management with a family history of cardiovascular disease who has had new onset of palpitations irregularity of her heartbeat that lasted for approximately an hour.  The patient was seen in the emergency room due to this irregular heartbeat at 170 bpm.  She had episodes of chest discomfort in the upper chest radiating into the throat.  This lasted the entire time that she had her palpitations.  Since then she has an EKG showing normal sinus rhythm with left ventricular hypertrophy and a troponin level of 36/89/199.  She did have resolution of her atrial fibrillation upon arrival to the emergency room.  Patient therefore needs further medication management and treatment options and currently feels well with no further significant symptoms.   Past Medical History:  Diagnosis Date   Anemia    Chronic diastolic (congestive) heart failure (HCC) 2017   GRADE 2-saw [place] in 2020-he states for pt to just f/u as needed   Complication of anesthesia    Family history of adverse reaction to anesthesia    DAUGHTER-N/V   GERD (gastroesophageal reflux disease)    History of kidney stones    H/O   Hypertension    PONV (postoperative nausea and vomiting)    N/V   Scoliosis    Vertigo 2012   X1      Surgical History:  Past Surgical History:  Procedure Laterality Date   DIAGNOSTIC LAPAROSCOPY  1990   DILATATION &  CURETTAGE/HYSTEROSCOPY WITH MYOSURE N/A 12/05/2019   Procedure: FRACTIONAL DILATATION & CURETTAGE/HYSTEROSCOPY WITH MYOSURE;  Surgeon: Schermerhorn, Ihor Austin, MD;  Location: [facility] ORS;  Service: Gynecology;  Laterality: N/A;   LITHOTRIPSY       Home Meds: Prior to Admission medications   Medication Sig Start Date End Date Taking? Authorizing Provider  alendronate (FOSAMAX) 70 MG tablet Take 70 mg by mouth once a week. Take with a full glass of water on an empty stomach.    [provider]  aspirin EC 81 MG tablet Take 1 tablet (81 mg total) by mouth daily. Patient not taking: Reported on 11/20/2019 08/19/15   Altamese Dilling, MD  atorvastatin (LIPITOR) 40 MG tablet Take 1 tablet (40 mg total) by mouth daily. Patient not taking: Reported on 11/20/2019 08/19/15   Altamese Dilling, MD  Calcium Carbonate-Vitamin D 600-400 MG-UNIT tablet Take 1 tablet by mouth daily with breakfast.     [provider]  famotidine (PEPCID) 20 MG tablet Take 20 mg by mouth 2 (two) times daily. 10/11/19   [provider]  Iron-Vitamin C (VITRON-C PO) Take 1 tablet by mouth daily.    [provider]  lisinopril (PRINIVIL,ZESTRIL) 5 MG tablet Take 1 tablet (5 mg total) by mouth daily. Patient not taking: Reported on 11/20/2019 08/19/15   Altamese Dilling, MD  losartan (COZAAR) 25 MG tablet Take 25 mg by mouth every morning.  10/31/19   [provider]  metoprolol succinate (TOPROL XL) 25 MG 24 hr tablet Take 1 tablet (25 mg total) by  mouth daily. Patient not taking: Reported on 11/20/2019 08/19/15   Vaughan Basta, MD  montelukast (SINGULAIR) 10 MG tablet Take 10 mg by mouth every evening.  10/07/19   [provider]    Inpatient Medications:   [START ON 03/28/2021] aspirin EC  81 mg Oral Daily   atorvastatin  40 mg Oral Daily   [START ON 03/28/2021] Calcium Carbonate-Vitamin D  1 tablet Oral Q breakfast   famotidine  20 mg Oral BID   Iron-Vitamin C    Oral Daily   losartan  25 mg Oral [id number]   metoprolol succinate  25 mg Oral Daily   montelukast  10 mg Oral QPM    heparin 1,100 Units/hr (03/27/21 1029)    Allergies:  Allergies  Allergen Reactions   Lisinopril Cough   Prednisone Other (See Comments)    Elevated blood pressure Felt like weight on her chest    Social History   Socioeconomic History   Marital status: Married    Spouse name: Not on file   Number of children: Not on file   Years of education: Not on file   Highest education level: Not on file  Occupational History   Not on file  Tobacco Use   Smoking status: Never   Smokeless tobacco: Never  Substance and Sexual Activity   Alcohol use: Yes    Comment: OCC BEER OR WINE   Drug use: Never   Sexual activity: Not on file  Other Topics Concern   Not on file  Social History Narrative   Not on file   Social Determinants of Health   Financial Resource Strain: Not on file  Food Insecurity: Not on file  Transportation Needs: Not on file  Physical Activity: Not on file  Stress: Not on file  Social Connections: Not on file  Intimate Partner Violence: Not on file     Family History  Problem Relation Age of Onset   Diverticulitis Mother    Atrial fibrillation Mother    Diabetes Father    Breast cancer Neg Hx      Review of Systems Positive for shortness of breath chest pain palpitations Negative for: General:  chills, fever, night sweats or weight changes.  Cardiovascular: PND orthopnea syncope dizziness  Dermatological skin lesions rashes Respiratory: Cough congestion Urologic: Frequent urination urination at night and hematuria Abdominal: negative for nausea, vomiting, diarrhea, bright red blood per rectum, melena, or hematemesis Neurologic: negative for visual changes, and/or hearing changes  All other systems reviewed and are otherwise negative except as noted above.  Labs: No results for input(s): CKTOTAL, CKMB, TROPONINI in the last 72  hours. Lab Results  Component Value Date   WBC 7.2 03/27/2021   HGB 12.5 03/27/2021   HCT 38.4 03/27/2021   MCV 86.3 03/27/2021   PLT 279 03/27/2021    Recent Labs  Lab 03/27/21 0235  NA 139  K 3.6  CL 106  CO2 28  BUN 20  CREATININE 0.95  CALCIUM 9.6  PROT 7.3  BILITOT 0.8  ALKPHOS 57  ALT 13  AST 21  GLUCOSE 163*   Lab Results  Component Value Date   CHOL 226 (H) 08/18/2015   HDL 69 08/18/2015   LDLCALC 143 (H) 08/18/2015   TRIG 68 08/18/2015   No results found for: DDIMER  Radiology/Studies:  DG Chest 2 View  Result Date: 03/27/2021 CLINICAL DATA:  65 year old female with chest pain. EXAM: CHEST - 2 VIEW COMPARISON:  08/18/2015 FINDINGS: The mediastinal contours are  within normal limits. No cardiomegaly. The lungs are clear bilaterally without evidence of focal consolidation, pleural effusion, or pneumothorax. No acute osseous abnormality. Similar appearing sigmoid curvature of the thoracolumbar spine. IMPRESSION: No acute cardiopulmonary process. Electronically Signed   By: Ruthann Cancer M.D.   On: 03/27/2021 09:50    EKG: Normal sinus rhythm with left ventricular hypertrophy  Weights: Filed Weights   03/27/21 0230  Weight: 90.7 kg     Physical Exam: Blood pressure (!) 143/64, pulse 64, temperature 98 F (36.7 C), temperature source Oral, resp. rate 18, height 5\' 7"  (1.702 m), weight 90.7 kg, SpO2 99 %. Body mass index is 31.32 kg/m. General: Well developed, well nourished, in no acute distress. Head eyes ears nose throat: Normocephalic, atraumatic, sclera non-icteric, no xanthomas, nares are without discharge. No apparent thyromegaly and/or mass  Lungs: Normal respiratory effort.  no wheezes, no rales, no rhonchi.  Heart: RRR with normal S1 S2.  3-4+ in upper sternal border murmur gallop, no rub, PMI is normal size and placement, carotid upstroke normal with bruit, jugular venous pressure is normal Abdomen: Soft, non-tender, non-distended with  normoactive bowel sounds. No hepatomegaly. No rebound/guarding. No obvious abdominal masses. Abdominal aorta is normal size without bruit Extremities: No edema. no cyanosis, no clubbing, no ulcers  Peripheral : 2+ bilateral upper extremity pulses, 2+ bilateral femoral pulses, 2+ bilateral dorsal pedal pulse Neuro: Alert and oriented. No facial asymmetry. No focal deficit. Moves all extremities spontaneously. Musculoskeletal: Normal muscle tone without kyphosis Psych:  Responds to questions appropriately with a normal affect.    Assessment: 65 year old female with hypertension hyperlipidemia and diastolic dysfunction heart failure with left ventricular hypertrophy on appropriate medication management with irregular heartbeat palpitations rapid rate consistent with atrial fibrillation and possible non-ST elevation myocardial infarction with elevated troponin and chest pain with probable aortic valve stenosis  Plan: 1.  Continue supportive care and low-dose beta-blocker for further risk reduction the potential atrial fibrillation with rapid ventricular rate 2.  Heparin for further risk reduction and non-ST elevation myocardial infarction 3.  Echocardiogram for LV systolic dysfunction valvular heart disease contributing to above with concerns of aortic valve stenosis 4.  Proceed to cardiac catheterization to assess coronary anatomy for non-ST elevation myocardial infarction as well as significance of aortic valve stenosis contributing to above.  Patient understands the risk and benefits of cardiac catheterization.  This includes a possibility of death stroke heart attack infection bleeding or blood clot.  She is at low risk for conscious sedation  Signed, Corey Skains M.D. [facility] 03/27/2021, 11:19 AM

## 2021-03-27 NOTE — ED Triage Notes (Signed)
Pt states she woke up around midnight with elevated hr in upper 170s on her apple watch with a fib. Pt states was having upper chest pain that radiated to back. Pt states elevated heart rate ended before she arrived to ed. Pt denies shob, dizziness.

## 2021-03-27 NOTE — ED Provider Notes (Signed)
HPI: Pt is a 65 y.o. female who presents with complaints of elevated HR  The patient p/w  palpitations and HR 170s on watch and saying AFIB now resolved.   ROS: Denies fever, chest pain, vomiting  Past Medical History:  Diagnosis Date   Anemia    Chronic diastolic (congestive) heart failure (HCC) 2017   GRADE 2-saw [place] in 2020-he states for pt to just f/u as needed   Complication of anesthesia    Family history of adverse reaction to anesthesia    DAUGHTER-N/V   GERD (gastroesophageal reflux disease)    History of kidney stones    H/O   Hypertension    PONV (postoperative nausea and vomiting)    N/V   Scoliosis    Vertigo 2012   X1   Vitals:   03/27/21 0233  Pulse: 87  Resp: 16    Focused Physical Exam: Gen: No acute distress Head: atraumatic, normocephalic Eyes: Extraocular movements grossly intact; conjunctiva clear CV: RRR Lung: No increased WOB, no stridor GI: ND, no obvious masses Neuro: Alert and awake  Medical Decision Making and Plan: Given the patient's initial medical screening exam, the following diagnostic evaluation has been ordered. The patient will be placed in the appropriate treatment space, once one is available, to complete the evaluation and treatment. I have discussed the plan of care with the patient and I have advised the patient that an ED physician or mid-level practitioner will reevaluate their condition after the test results have been received, as the results may give them additional insight into the type of treatment they may need.   Diagnostics: labs, sinus now.   Treatments: none immediately   Concha Se, MD 03/27/21 [phone]

## 2021-03-27 NOTE — H&P (Signed)
History and Physical    Stephanie Cooper DOB: June 18, 1955 DOA: 03/27/2021  PCP: Lynnea Ferrier, MD   Patient coming from: Home  I have personally briefly reviewed patient's old medical records in [facility]  Chief Complaint: Palpitations  HPI: Stephanie Cooper is a 65 y.o. female with medical history significant for chronic diastolic dysfunction CHF, hypertension, GERD who presents to the ER via private vehicle for evaluation after she developed palpitations that woke her up from sleep at about midnight.  She states that she also had some midsternal chest discomfort which she initially thought was due to heartburn but this episode felt different.  She noted that her heart rate was in the 170s when on her apple watch she noted that she was in atrial fibrillation.  She states that the midsternal chest discomfort radiated to the back but denied having any associated nausea, no vomiting, no diaphoresis, no shortness of breath.  Her symptoms resolved after 2 hours. At the time of my evaluation she is chest pain-free and is in sinus rhythm. She does not have a history of coronary artery disease but states that her maternal grandparents died coronary artery disease in the 4s.  Mother had a history of atrial fibrillation. She denies having any fever, no chills, no abdominal pain, no changes in her bowel habits, no headache, no dizziness, no lightheadedness, no urinary symptoms, no changes in her bowel habits, no cough, no orthopnea, no PND, no leg swelling, no anorexia, no blurred vision no focal deficit. Labs show sodium 139, potassium 3.6, chloride 106, bicarb 28, glucose 163, BUN 20, creatinine 0.95, calcium 9.6, anion gap 5, magnesium 1.9, alkaline phosphatase 57, albumin 3.8, AST 21, ALT 13, total protein 7.3, troponin 36 >> 89 >> 199, white count 7.2, hemoglobin 12.5, hematocrit 38.4, MCV 86.3, RDW 15.4, platelet count 279, TSH 3.49 Chest x-ray reviewed by me shows no  evidence of acute cardiopulmonary disease Twelve-lead EKG reviewed by me shows normal sinus rhythm.  LVH.   ED Course: Patient is a 65 year old female who presents to the ER for evaluation of palpitations that woke her up out of sleep associated with midsternal chest discomfort which have all resolved.  On her apple watch she was noted to be in atrial fibrillation but does not have a known history of same. Her troponin levels are uptrending and she will be referred to observation status for further evaluation.   Review of Systems: As per HPI otherwise all other systems reviewed and negative.    Past Medical History:  Diagnosis Date   Anemia    Chronic diastolic (congestive) heart failure (HCC) 2017   GRADE 2-saw [facility] in 2020-he states for pt to just f/u as needed   Complication of anesthesia    Family history of adverse reaction to anesthesia    DAUGHTER-N/V   GERD (gastroesophageal reflux disease)    History of kidney stones    H/O   Hypertension    PONV (postoperative nausea and vomiting)    N/V   Scoliosis    Vertigo 2012   X1    Past Surgical History:  Procedure Laterality Date   DIAGNOSTIC LAPAROSCOPY  1990   DILATATION & CURETTAGE/HYSTEROSCOPY WITH MYOSURE N/A 12/05/2019   Procedure: FRACTIONAL DILATATION & CURETTAGE/HYSTEROSCOPY WITH MYOSURE;  Surgeon: Schermerhorn, Ihor Austin, MD;  Location: [facility] ORS;  Service: Gynecology;  Laterality: N/A;   LITHOTRIPSY       reports that she has never smoked. She has never used  smokeless tobacco. She reports current alcohol use. She reports that she does not use drugs.  Allergies  Allergen Reactions   Lisinopril Cough   Prednisone Other (See Comments)    Elevated blood pressure Felt like weight on her chest    Family History  Problem Relation Age of Onset   Diverticulitis Mother    Atrial fibrillation Mother    Diabetes Father    Breast cancer Neg Hx       Prior to Admission medications   Medication Sig Start Date  End Date Taking? Authorizing Provider  alendronate (FOSAMAX) 70 MG tablet Take 70 mg by mouth once a week. Take with a full glass of water on an empty stomach.    [provider]  aspirin EC 81 MG tablet Take 1 tablet (81 mg total) by mouth daily. Patient not taking: Reported on 11/20/2019 08/19/15   Vaughan Basta, MD  atorvastatin (LIPITOR) 40 MG tablet Take 1 tablet (40 mg total) by mouth daily. Patient not taking: Reported on 11/20/2019 08/19/15   Vaughan Basta, MD  Calcium Carbonate-Vitamin D 600-400 MG-UNIT tablet Take 1 tablet by mouth daily with breakfast.     [provider]  famotidine (PEPCID) 20 MG tablet Take 20 mg by mouth 2 (two) times daily. 10/11/19   [provider]  Iron-Vitamin C (VITRON-C PO) Take 1 tablet by mouth daily.    [provider]  lisinopril (PRINIVIL,ZESTRIL) 5 MG tablet Take 1 tablet (5 mg total) by mouth daily. Patient not taking: Reported on 11/20/2019 08/19/15   Vaughan Basta, MD  losartan (COZAAR) 25 MG tablet Take 25 mg by mouth every morning.  10/31/19   [provider]  metoprolol succinate (TOPROL XL) 25 MG 24 hr tablet Take 1 tablet (25 mg total) by mouth daily. Patient not taking: Reported on 11/20/2019 08/19/15   Vaughan Basta, MD  montelukast (SINGULAIR) 10 MG tablet Take 10 mg by mouth every evening.  10/07/19   [provider]    Physical Exam: Vitals:   03/27/21 0445 03/27/21 0830 03/27/21 0900 03/27/21 0930  BP: 129/73 (!) 175/63 (!) 142/57 131/61  Pulse: 78 72 66 60  Resp: 20 19 (!) 21 (!) 21  Temp:      TempSrc:      SpO2: 97% 100% 100% 98%  Weight:      Height:         Vitals:   03/27/21 0445 03/27/21 0830 03/27/21 0900 03/27/21 0930  BP: 129/73 (!) 175/63 (!) 142/57 131/61  Pulse: 78 72 66 60  Resp: 20 19 (!) 21 (!) 21  Temp:      TempSrc:      SpO2: 97% 100% 100% 98%  Weight:      Height:          Constitutional: Alert and oriented x 3 . Not in any  apparent distress HEENT:      Head: Normocephalic and atraumatic.         Eyes: PERLA, EOMI, Conjunctivae are normal. Sclera is non-icteric.       Mouth/Throat: Mucous membranes are moist.       Neck: Supple with no signs of meningismus. Cardiovascular: Regular rate and rhythm. No murmurs, gallops, or rubs. 2+ symmetrical distal pulses are present . No JVD. No LE edema Respiratory: Respiratory effort normal .Lungs sounds clear bilaterally. No wheezes, crackles, or rhonchi.  Gastrointestinal: Soft, non tender, and non distended with positive bowel sounds.  Genitourinary: No CVA tenderness. Musculoskeletal: Nontender with normal  range of motion in all extremities. No cyanosis, or erythema of extremities. Neurologic:  Face is symmetric. Moving all extremities. No gross focal neurologic deficits . Skin: Skin is warm, dry.  No rash or ulcers Psychiatric: Mood and affect are normal    Labs on Admission: I have personally reviewed following labs and imaging studies  CBC: Recent Labs  Lab 03/27/21 0235  WBC 7.2  HGB 12.5  HCT 38.4  MCV 86.3  PLT [id number]   Basic Metabolic Panel: Recent Labs  Lab 03/27/21 0235  NA 139  K 3.6  CL 106  CO2 28  GLUCOSE 163*  BUN 20  CREATININE 0.95  CALCIUM 9.6  MG 1.9   GFR: Estimated Creatinine Clearance: 66.4 mL/min (by C-G formula based on SCr of 0.95 mg/dL). Liver Function Tests: Recent Labs  Lab 03/27/21 0235  AST 21  ALT 13  ALKPHOS 57  BILITOT 0.8  PROT 7.3  ALBUMIN 3.8   No results for input(s): LIPASE, AMYLASE in the last 168 hours. No results for input(s): AMMONIA in the last 168 hours. Coagulation Profile: No results for input(s): INR, PROTIME in the last 168 hours. Cardiac Enzymes: No results for input(s): CKTOTAL, CKMB, CKMBINDEX, TROPONINI in the last 168 hours. BNP (last 3 results) No results for input(s): PROBNP in the last 8760 hours. HbA1C: No results for input(s): HGBA1C in the last 72 hours. CBG: No results for  input(s): GLUCAP in the last 168 hours. Lipid Profile: No results for input(s): CHOL, HDL, LDLCALC, TRIG, CHOLHDL, LDLDIRECT in the last 72 hours. Thyroid Function Tests: Recent Labs    03/27/21 0235  TSH 3.498  FREET4 0.80   Anemia Panel: No results for input(s): VITAMINB12, FOLATE, FERRITIN, TIBC, IRON, RETICCTPCT in the last 72 hours. Urine analysis: No results found for: COLORURINE, APPEARANCEUR, LABSPEC, PHURINE, GLUCOSEU, HGBUR, BILIRUBINUR, KETONESUR, PROTEINUR, UROBILINOGEN, NITRITE, LEUKOCYTESUR  Radiological Exams on Admission: DG Chest 2 View  Result Date: 03/27/2021 CLINICAL DATA:  65 year old female with chest pain. EXAM: CHEST - 2 VIEW COMPARISON:  08/18/2015 FINDINGS: The mediastinal contours are within normal limits. No cardiomegaly. The lungs are clear bilaterally without evidence of focal consolidation, pleural effusion, or pneumothorax. No acute osseous abnormality. Similar appearing sigmoid curvature of the thoracolumbar spine. IMPRESSION: No acute cardiopulmonary process. Electronically Signed   By: Ruthann Cancer M.D.   On: 03/27/2021 09:50     Assessment/Plan Active Problems:   NSTEMI (non-ST elevated myocardial infarction) (HCC)   Chronic diastolic (congestive) heart failure (HCC)   GERD (gastroesophageal reflux disease)   Hypertension    Patient is a 65 year old female admitted to the hospital for possible non-ST elevation MI.   Acute non-ST elevation MI Patient presents for evaluation of palpitations that woke her up out of her sleep associated with midsternal chest discomfort. Her symptoms have resolved but she has an uptrending troponin level Cycle cardiac enzymes Continue aspirin 81 mg daily Continue heparin drip initiated in the ER Continue metoprolol and atorvastatin Obtain 2D echocardiogram to assess LVEF and rule out regional wall motion abnormality Consult cardiology   Hypertension Blood pressure is controlled on metoprolol and  Cozaar   History of GERD Stable Continue Pepcid   DVT prophylaxis: Heparin Code Status: full code  Family Communication: Greater than 50% of time was spent discussing patient's condition and plan of care with her at the bedside.  All questions and concerns have been addressed.  She verbalizes understanding and agrees with the plan. Disposition Plan: Back to previous home environment Consults called:  Cardiology  Status:Observation    Collier Bullock MD [facility]     03/27/2021, 10:35 AM

## 2021-03-28 ENCOUNTER — Encounter: Payer: Self-pay | Admitting: Internal Medicine

## 2021-03-28 ENCOUNTER — Observation Stay
Admit: 2021-03-28 | Discharge: 2021-03-28 | Disposition: A | Discharge status: Home / Self Care | Payer: BC Managed Care – PPO | Attending: Internal Medicine | Admitting: Internal Medicine

## 2021-03-28 ENCOUNTER — Encounter
Admission: EM | Disposition: A | Discharge status: Home / Self Care | Payer: Self-pay | Source: Home / Self Care | Attending: Emergency Medicine

## 2021-03-28 DIAGNOSIS — R778 Other specified abnormalities of plasma proteins: Secondary | ICD-10-CM

## 2021-03-28 DIAGNOSIS — I421 Obstructive hypertrophic cardiomyopathy: Secondary | ICD-10-CM

## 2021-03-28 DIAGNOSIS — I340 Nonrheumatic mitral (valve) insufficiency: Secondary | ICD-10-CM

## 2021-03-28 DIAGNOSIS — I480 Paroxysmal atrial fibrillation: Secondary | ICD-10-CM

## 2021-03-28 HISTORY — PX: LEFT HEART CATH AND CORONARY ANGIOGRAPHY: CATH118249

## 2021-03-28 LAB — ECHOCARDIOGRAM COMPLETE
AR max vel: 5.77 cm2
AV Area VTI: 6 cm2
AV Area mean vel: 6.02 cm2
AV Mean grad: 14.7 mmHg
AV Peak grad: 33.3 mmHg
Ao pk vel: 2.88 m/s
Area-P 1/2: 2.32 cm2
Height: 67 in
MV VTI: 7.17 cm2
S' Lateral: 2.1 cm
Weight: 3200 oz

## 2021-03-28 LAB — BASIC METABOLIC PANEL
Anion gap: 5 (ref 5–15)
BUN: 16 mg/dL (ref 8–23)
CO2: 25 mmol/L (ref 22–32)
Calcium: 8.2 mg/dL — ABNORMAL LOW (ref 8.9–10.3)
Chloride: 107 mmol/L (ref 98–111)
Creatinine, Ser: 0.79 mg/dL (ref 0.44–1.00)
GFR, Estimated: >60 mL/min (ref >60)
Glucose, Bld: 111 mg/dL — ABNORMAL HIGH (ref 70–99)
Potassium: 3.5 mmol/L (ref 3.5–5.1)
Sodium: 137 mmol/L (ref 135–145)

## 2021-03-28 LAB — HEPARIN LEVEL (UNFRACTIONATED): Heparin Unfractionated: 0.43 IU/mL (ref 0.30–0.70)

## 2021-03-28 LAB — CBC
HCT: 35.6 % — ABNORMAL LOW (ref 36.0–46.0)
Hemoglobin: 11.3 g/dL — ABNORMAL LOW (ref 12.0–15.0)
MCH: 27.9 pg (ref 26.0–34.0)
MCHC: 31.7 g/dL (ref 30.0–36.0)
MCV: 87.9 fL (ref 80.0–100.0)
Platelets: 254 10*3/uL (ref 150–400)
RBC: 4.05 MIL/uL (ref 3.87–5.11)
RDW: 14.6 % (ref 11.5–15.5)
WBC: 6.6 10*3/uL (ref 4.0–10.5)
nRBC: 0 % (ref 0.0–0.2)

## 2021-03-28 LAB — CARDIAC CATHETERIZATION: Cath EF Quantitative: 75 %

## 2021-03-28 SURGERY — LEFT HEART CATH AND CORONARY ANGIOGRAPHY
Anesthesia: Moderate Sedation

## 2021-03-28 MED ORDER — VERAPAMIL HCL ER 120 MG PO TBCR: 120.0000 mg | EXTENDED_RELEASE_TABLET | Freq: Every day | ORAL | Status: DC

## 2021-03-28 MED ORDER — SODIUM CHLORIDE 0.9 % WEIGHT BASED INFUSION: 1.0000 mL/kg/h | INTRAVENOUS | Status: DC

## 2021-03-28 MED ORDER — APIXABAN 5 MG PO TABS
5.0000 mg | ORAL_TABLET | Freq: Two times a day (BID) | ORAL | 0 refills | Status: AC
Start: 2021-03-28 — End: ?

## 2021-03-28 MED ORDER — ONDANSETRON HCL 4 MG/2ML IJ SOLN: 4.0000 mg | Freq: Four times a day (QID) | INTRAMUSCULAR | Status: DC | PRN

## 2021-03-28 MED ORDER — MIDAZOLAM HCL 2 MG/2ML IJ SOLN
INTRAMUSCULAR | Status: DC | PRN
  Administered 2021-03-28: 1 mg via INTRAVENOUS

## 2021-03-28 MED ORDER — ASPIRIN 81 MG PO CHEW
CHEWABLE_TABLET | ORAL | Status: AC
  Administered 2021-03-28: 81 mg via ORAL
  Filled 2021-03-28: qty 1

## 2021-03-28 MED ORDER — LABETALOL HCL 5 MG/ML IV SOLN: 10.0000 mg | INTRAVENOUS | Status: DC | PRN

## 2021-03-28 MED ORDER — LIDOCAINE HCL (PF) 1 % IJ SOLN
INTRAMUSCULAR | Status: DC | PRN
  Administered 2021-03-28: 2 mL

## 2021-03-28 MED ORDER — FENTANYL CITRATE (PF) 100 MCG/2ML IJ SOLN
INTRAMUSCULAR | Status: AC
  Filled 2021-03-28: qty 2

## 2021-03-28 MED ORDER — ACETAMINOPHEN 325 MG PO TABS: 650.0000 mg | ORAL_TABLET | ORAL | Status: DC | PRN

## 2021-03-28 MED ORDER — ASPIRIN 81 MG PO CHEW: 81.0000 mg | CHEWABLE_TABLET | ORAL | Status: AC

## 2021-03-28 MED ORDER — SODIUM CHLORIDE 0.9 % IV SOLN: 250.0000 mL | INTRAVENOUS | Status: DC | PRN

## 2021-03-28 MED ORDER — HEPARIN SODIUM (PORCINE) 1000 UNIT/ML IJ SOLN
INTRAMUSCULAR | Status: DC | PRN
  Administered 2021-03-28: 4500 [IU] via INTRAVENOUS

## 2021-03-28 MED ORDER — VERAPAMIL HCL 2.5 MG/ML IV SOLN
INTRAVENOUS | Status: AC
  Filled 2021-03-28: qty 2

## 2021-03-28 MED ORDER — HEPARIN (PORCINE) IN NACL 1000-0.9 UT/500ML-% IV SOLN
INTRAVENOUS | Status: DC | PRN
  Administered 2021-03-28: 1000 mL

## 2021-03-28 MED ORDER — HEPARIN (PORCINE) IN NACL 1000-0.9 UT/500ML-% IV SOLN
INTRAVENOUS | Status: AC
  Filled 2021-03-28: qty 1000

## 2021-03-28 MED ORDER — VERAPAMIL HCL 2.5 MG/ML IV SOLN
INTRAVENOUS | Status: DC | PRN
  Administered 2021-03-28: 2.5 mg via INTRA_ARTERIAL

## 2021-03-28 MED ORDER — MIDAZOLAM HCL 2 MG/2ML IJ SOLN
INTRAMUSCULAR | Status: AC
  Filled 2021-03-28: qty 2

## 2021-03-28 MED ORDER — APIXABAN 5 MG PO TABS
5.0000 mg | ORAL_TABLET | Freq: Two times a day (BID) | ORAL | Status: DC
  Administered 2021-03-28: 5 mg via ORAL
  Filled 2021-03-28: qty 1

## 2021-03-28 MED ORDER — HYDRALAZINE HCL 20 MG/ML IJ SOLN: 10.0000 mg | INTRAMUSCULAR | Status: DC | PRN

## 2021-03-28 MED ORDER — FENTANYL CITRATE (PF) 100 MCG/2ML IJ SOLN
INTRAMUSCULAR | Status: DC | PRN
  Administered 2021-03-28: 50 ug via INTRAVENOUS

## 2021-03-28 MED ORDER — IOHEXOL 350 MG/ML SOLN
INTRAVENOUS | Status: DC | PRN
  Administered 2021-03-28: 30 mL via INTRA_ARTERIAL

## 2021-03-28 MED ORDER — SODIUM CHLORIDE 0.9% FLUSH: 3.0000 mL | INTRAVENOUS | Status: DC | PRN

## 2021-03-28 MED ORDER — HEPARIN SODIUM (PORCINE) 1000 UNIT/ML IJ SOLN
INTRAMUSCULAR | Status: AC
  Filled 2021-03-28: qty 1

## 2021-03-28 MED ORDER — SODIUM CHLORIDE 0.9 % WEIGHT BASED INFUSION
3.0000 mL/kg/h | INTRAVENOUS | Status: DC
  Administered 2021-03-28: 3 mL/kg/h via INTRAVENOUS

## 2021-03-28 MED ORDER — LIDOCAINE HCL 1 % IJ SOLN
INTRAMUSCULAR | Status: AC
  Filled 2021-03-28: qty 20

## 2021-03-28 MED ORDER — VERAPAMIL HCL ER 120 MG PO TBCR: 120.0000 mg | EXTENDED_RELEASE_TABLET | Freq: Every day | ORAL | 0 refills | Status: DC

## 2021-03-28 SURGICAL SUPPLY — 11 items
CATH INFINITI 5 FR JL3.5 (CATHETERS) ×2 IMPLANT
CATH INFINITI JR4 5F (CATHETERS) ×2 IMPLANT
DEVICE RAD TR BAND REGULAR (VASCULAR PRODUCTS) ×2 IMPLANT
DRAPE BRACHIAL (DRAPES) ×2 IMPLANT
GLIDESHEATH SLEND SS 6F .021 (SHEATH) ×2 IMPLANT
GUIDEWIRE INQWIRE 1.5J.035X260 (WIRE) ×1 IMPLANT
INQWIRE 1.5J .035X260CM (WIRE) ×2
PACK CARDIAC CATH (CUSTOM PROCEDURE TRAY) ×4 IMPLANT
PROTECTION STATION PRESSURIZED (MISCELLANEOUS) ×2
SET ATX SIMPLICITY (MISCELLANEOUS) ×2 IMPLANT
STATION PROTECTION PRESSURIZED (MISCELLANEOUS) ×1 IMPLANT

## 2021-03-28 NOTE — Progress Notes (Signed)
*  PRELIMINARY RESULTS* Echocardiogram 2D Echocardiogram has been performed.  Cristela Blue 03/28/2021, 9:25 AM

## 2021-03-28 NOTE — Consult Note (Signed)
ANTICOAGULATION CONSULT NOTE   Pharmacy Consult for Heparin Indication: chest pain/ACS  Allergies  Allergen Reactions   Lisinopril Cough   Prednisone Other (See Comments)    Elevated blood pressure Felt like weight on her chest    Patient Measurements: Height: 5\' 7"  (170.2 cm) Weight: 90.7 kg (200 lb) IBW/kg (Calculated) : 61.6 Heparin Dosing Weight: 81.1 kg  Vital Signs: Temp: 98.6 F (37 C) (11/14 0644) Temp Source: Oral (11/14 0644) BP: 127/63 (11/14 0500) Pulse Rate: 48 (11/14 0500)  Labs: Recent Labs    03/27/21 0235 03/27/21 0446 03/27/21 0907 03/27/21 1031 03/27/21 1403 03/27/21 1700 03/27/21 2303 03/28/21 0604  HGB 12.5  --   --   --   --   --   --  11.3*  HCT 38.4  --   --   --   --   --   --  35.6*  PLT 279  --   --   --   --   --   --  254  APTT  --   --   --  98*  --   --   --   --   LABPROT  --   --   --  13.7  --   --   --   --   INR  --   --   --  1.1  --   --   --   --   HEPARINUNFRC  --   --   --   --   --  0.35 0.33 0.43  CREATININE 0.95  --   --   --   --   --   --  0.79  TROPONINIHS 36*   < > 199* 231* 212*  --   --   --    < > = values in this interval not displayed.     Estimated Creatinine Clearance: 78.9 mL/min (by C-G formula based on SCr of 0.79 mg/dL).   Medical History: Past Medical History:  Diagnosis Date   Anemia    Chronic diastolic (congestive) heart failure (HCC) 2017   GRADE 2-saw [facility] in 2020-he states for pt to just f/u as needed   Complication of anesthesia    Family history of adverse reaction to anesthesia    DAUGHTER-N/V   GERD (gastroesophageal reflux disease)    History of kidney stones    H/O   Hypertension    PONV (postoperative nausea and vomiting)    N/V   Scoliosis    Vertigo 2012   X1    Medications:  (Not in a hospital admission) Scheduled:  Infusions:  PRN:  Anti-infectives (From admission, onward)    None       Assessment: Pharmacy consulted to start heparin for ACS. Troponin  elevated. Denies chest pain. No DOAC PTA. Baseline labs ordered.   11/13 1700 HL 0.35  11/13 2303 HL 0.33, therapeutic X 2  11/14 0604 HL 0.43, therapeutic X 3   Goal of Therapy:  Heparin level 0.3-0.7 units/ml Monitor platelets by anticoagulation protocol: Yes   Plan:  11/14:  HL @ 0604 = 0.43, therapeutic X 3 Will continue pt on current rate and recheck HL on 11/15 with AM labs.   Maiko Salais D, PharmD 03/28/2021,7:13 AM

## 2021-03-28 NOTE — ED Notes (Signed)
Patient in nad.  No pain.  Nsr.

## 2021-03-28 NOTE — ED Notes (Signed)
Ambulated to bathroom and back without assist.

## 2021-03-28 NOTE — Progress Notes (Signed)
[facility] Cardiology [facility] Encounter Note  Patient: Stephanie Cooper / Admit Date: 03/27/2021 / Date of Encounter: 03/28/2021, 11:09 AM   Subjective: Patient done well overnight with no evidence of significant chest pain rhythm disturbances or other concerns.  Blood pressure slightly elevated. Patient did have elevated troponin most consistent with current conditions and demand ischemia rather than acute coronary syndrome  Echocardiogram showing hyperdynamic LV function with ejection fraction of 75% with asymmetric septal hypertrophy and significant increase subaortic velocities consistent with hypertrophic obstructive cardiomyopathy.  Patient has no evidence of aortic valve stenosis  Cardiac catheterization confirming significant increase in subaortic velocities consistent with hypertrophic obstructive cardiomyopathy without evidence of aortic valve stenosis Patient had normal coronary arteries  Review of Systems: Positive for: Palpitation shortness of breath yesterday Negative for: Vision change, hearing change, syncope, dizziness, nausea, vomiting,diarrhea, bloody stool, stomach pain, cough, congestion, diaphoresis, urinary frequency, urinary pain,skin lesions, skin rashes Others previously listed  Objective: Telemetry: Normal sinus rhythm Physical Exam: Blood pressure (!) 150/80, pulse 70, temperature 98.2 F (36.8 C), temperature source Oral, resp. rate 19, height 5\' 7"  (1.702 m), weight 90.7 kg, SpO2 95 %. Body mass index is 31.32 kg/m. General: Well developed, well nourished, in no acute distress. Head: Normocephalic, atraumatic, sclera non-icteric, no xanthomas, nares are without discharge. Neck: No apparent masses Lungs: Normal respirations with no wheezes, no rhonchi, no rales , no crackles   Heart: Regular rate and rhythm, normal S1 S2, 3+ right and left upper sternal border murmur, no rub, no gallop, PMI is normal size and placement, carotid upstroke normal without  bruit, jugular venous pressure normal Abdomen: Soft, non-tender, non-distended with normoactive bowel sounds. No hepatosplenomegaly. Abdominal aorta is normal size without bruit Extremities: No edema, no clubbing, no cyanosis, no ulcers,  Peripheral: 2+ radial, 2+ femoral, 2+ dorsal pedal pulses Neuro: Alert and oriented. Moves all extremities spontaneously. Psych:  Responds to questions appropriately with a normal affect.   Intake/Output Summary (Last 24 hours) at 03/28/2021 1109 Last data filed at 03/28/2021 0721 Gross per 24 hour  Intake 265.52 ml  Output --  Net 265.52 ml    Inpatient Medications:   [MAR Hold] aspirin EC  81 mg Oral Daily   [MAR Hold] atorvastatin  40 mg Oral Daily   [MAR Hold] calcium-vitamin D  1 tablet Oral Q breakfast   [MAR Hold] famotidine  20 mg Oral BID   [MAR Hold] losartan  25 mg Oral Daily   [MAR Hold] montelukast  10 mg Oral QPM   [MAR Hold] sodium chloride flush  3 mL Intravenous Q12H   verapamil  120 mg Oral Daily   Infusions:   sodium chloride     [START ON 03/29/2021] sodium chloride 3 mL/kg/hr (03/28/21 1018)   Followed by   03/30/21 ON 03/29/2021] sodium chloride     heparin Stopped (03/28/21 0935)    Labs: Recent Labs    03/27/21 0235 03/28/21 0604  NA 139 137  K 3.6 3.5  CL 106 107  CO2 28 25  GLUCOSE 163* 111*  BUN 20 16  CREATININE 0.95 0.79  CALCIUM 9.6 8.2*  MG 1.9  --    Recent Labs    03/27/21 0235  AST 21  ALT 13  ALKPHOS 57  BILITOT 0.8  PROT 7.3  ALBUMIN 3.8   Recent Labs    03/27/21 0235 03/28/21 0604  WBC 7.2 6.6  HGB 12.5 11.3*  HCT 38.4 35.6*  MCV 86.3 87.9  PLT 279 254  No results for input(s): CKTOTAL, CKMB, TROPONINI in the last 72 hours. Invalid input(s): POCBNP No results for input(s): HGBA1C in the last 72 hours.   Weights: Filed Weights   03/27/21 0230  Weight: 90.7 kg     Radiology/Studies:  DG Chest 2 View  Result Date: 03/27/2021 CLINICAL DATA:  65 year old female with  chest pain. EXAM: CHEST - 2 VIEW COMPARISON:  08/18/2015 FINDINGS: The mediastinal contours are within normal limits. No cardiomegaly. The lungs are clear bilaterally without evidence of focal consolidation, pleural effusion, or pneumothorax. No acute osseous abnormality. Similar appearing sigmoid curvature of the thoracolumbar spine. IMPRESSION: No acute cardiopulmonary process. Electronically Signed   By: Marliss Coots M.D.   On: 03/27/2021 09:50   ECHOCARDIOGRAM COMPLETE  Result Date: 03/28/2021    ECHOCARDIOGRAM REPORT   Patient Name:   Stephanie Cooper Date of Exam: 03/28/2021 Medical Rec #:  [id number]         Height:       67.0 in Accession #:    [id number]        Weight:       200.0 lb Date of Birth:  1955-07-29          BSA:          2.022 m Patient Age:    65 years          BP:           126/62 mmHg Patient Gender: F                 HR:           58 bpm. Exam Location:  [facility] Procedure: 2D Echo, Cardiac Doppler and Color Doppler Indications:     NSTEMI I21.4  History:         Patient has prior history of Echocardiogram examinations, most                  recent 08/18/2015. CHF; Risk Factors:Hypertension.  Sonographer:     Cristela Blue Referring Phys:  [id number] AGBATA Diagnosing Phys: Arnoldo Hooker MD IMPRESSIONS  1. Aympetric septal hypertrophy with increased LVOT velocities and moderate SAM. Left ventricular ejection fraction, by estimation, is 70 to 75%. The left ventricle has hyperdynamic function. The left ventricle has no regional wall motion abnormalities.  There is moderate left ventricular hypertrophy. Left ventricular diastolic parameters are consistent with Grade II diastolic dysfunction (pseudonormalization).  2. Right ventricular systolic function is normal. The right ventricular size is normal.  3. Left atrial size was mildly dilated.  4. The mitral valve is normal in structure. Moderate mitral valve regurgitation.  5. The aortic valve is normal in structure. Aortic valve regurgitation  is not visualized. FINDINGS  Left Ventricle: Aympetric septal hypertrophy with increased LVOT velocities and moderate SAM. Left ventricular ejection fraction, by estimation, is 70 to 75%. The left ventricle has hyperdynamic function. The left ventricle has no regional wall motion abnormalities. The left ventricular internal cavity size was normal in size. There is moderate left ventricular hypertrophy. Left ventricular diastolic parameters are consistent with Grade II diastolic dysfunction (pseudonormalization). Right Ventricle: The right ventricular size is normal. No increase in right ventricular wall thickness. Right ventricular systolic function is normal. Left Atrium: Left atrial size was mildly dilated. Right Atrium: Right atrial size was normal in size. Pericardium: There is no evidence of pericardial effusion. Mitral Valve: The mitral valve is normal in structure. Moderate mitral valve regurgitation. MV peak gradient, 9.7 mmHg. The mean  mitral valve gradient is 3.0 mmHg. Tricuspid Valve: The tricuspid valve is normal in structure. Tricuspid valve regurgitation is mild. Aortic Valve: The aortic valve is normal in structure. Aortic valve regurgitation is not visualized. Aortic valve mean gradient measures 14.7 mmHg. Aortic valve peak gradient measures 33.3 mmHg. Aortic valve area, by VTI measures 6.00 cm. Pulmonic Valve: The pulmonic valve was normal in structure. Pulmonic valve regurgitation is not visualized. Aorta: The aortic root and ascending aorta are structurally normal, with no evidence of dilitation. IAS/Shunts: No atrial level shunt detected by color flow Doppler.  LEFT VENTRICLE PLAX 2D LVIDd:         4.10 cm   Diastology LVIDs:         2.10 cm   LV e' medial:    4.13 cm/s LV PW:         1.90 cm   LV E/e' medial:  24.2 LV IVS:        1.40 cm   LV e' lateral:   3.37 cm/s LVOT diam:     2.00 cm   LV E/e' lateral: 29.6 LV SV:         324 LV SV Index:   160 LVOT Area:     3.14 cm  RIGHT VENTRICLE RV  Basal diam:  4.10 cm RV S prime:     11.40 cm/s LEFT ATRIUM             Index        RIGHT ATRIUM           Index LA diam:        4.90 cm 2.42 cm/m   RA Area:     23.40 cm LA Vol (A2C):   49.2 ml 24.33 ml/m  RA Volume:   79.80 ml  39.46 ml/m LA Vol (A4C):   82.0 ml 40.55 ml/m LA Biplane Vol: 66.8 ml 33.03 ml/m  AORTIC VALVE                     PULMONIC VALVE AV Area (Vmax):    5.77 cm      PV Vmax:        0.65 m/s AV Area (Vmean):   6.02 cm      PV Vmean:       38.700 cm/s AV Area (VTI):     6.00 cm      PV VTI:         0.106 m AV Vmax:           288.33 cm/s   PV Peak grad:   1.7 mmHg AV Vmean:          164.333 cm/s  PV Mean grad:   1.0 mmHg AV VTI:            0.540 m       RVOT Peak grad: 5 mmHg AV Peak Grad:      33.3 mmHg AV Mean Grad:      14.7 mmHg LVOT Vmax:         530.00 cm/s LVOT Vmean:        315.000 cm/s LVOT VTI:          1.030 m LVOT/AV VTI ratio: 1.91  AORTA Ao Root diam: 3.14 cm MITRAL VALVE                TRICUSPID VALVE MV Area (PHT): 2.32 cm     TR Peak grad:   93.7 mmHg MV Area VTI:  7.17 cm     TR Vmax:        484.00 cm/s MV Peak grad:  9.7 mmHg MV Mean grad:  3.0 mmHg     SHUNTS MV Vmax:       1.56 m/s     Systemic VTI:  1.03 m MV Vmean:      82.3 cm/s    Systemic Diam: 2.00 cm MV Decel Time: 327 msec     Pulmonic VTI:  0.237 m MV E velocity: 99.80 cm/s MV A velocity: 111.00 cm/s MV E/A ratio:  0.90 Arnoldo Hooker MD Electronically signed by Arnoldo Hooker MD Signature Date/Time: 03/28/2021/9:37:57 AM    Final      Assessment and Recommendation  65 y.o. female with acute episode of paroxysmal nonvalvular atrial fibrillation with chest pain and pressure most consistent with hypertrophic obstructive cardiomyopathy with rapid heart rate and minimal elevation of troponin consistent with demand ischemia rather than acute coronary syndrome  Cardiac catheterization echocardiogram confirming increased subaortic velocities consistent with hypertrophic obstructive cardiomyopathy and no  evidence of aortic valve stenosis  Plan 1.  Change in antihypertensive medication management to verapamil 120 mg each day for better heart rate control maintenance of normal sinus rhythm and risk reduction in hypertrophic obstructive cardiomyopathy and/or diastolic dysfunction 2.  Continue other antihypertensive medication management and no use of diuretics due to concerns of dehydration and exacerbation of hypertrophic obstructive cardiomyopathy and outflow velocities 3.  Reasonable for anticoagulation for 1 month for atrial fibrillation and possible recurrence but then okay for discontinuation if no reoccurrence as an outpatient 4.  If patient ambulating well with no other further significant symptoms okay for discharged home from cardiac standpoint  Signed, Arnoldo Hooker M.D. FACC

## 2021-03-28 NOTE — Consult Note (Signed)
ANTICOAGULATION CONSULT NOTE   Pharmacy Consult for Heparin Indication: chest pain/ACS  Allergies  Allergen Reactions   Lisinopril Cough   Prednisone Other (See Comments)    Elevated blood pressure Felt like weight on her chest    Patient Measurements: Height: 5\' 7"  (170.2 cm) Weight: 90.7 kg (200 lb) IBW/kg (Calculated) : 61.6 Heparin Dosing Weight: 81.1 kg  Vital Signs: BP: 110/38 (11/13 2300) Pulse Rate: 65 (11/13 2300)  Labs: Recent Labs    03/27/21 0235 03/27/21 0446 03/27/21 0907 03/27/21 1031 03/27/21 1403 03/27/21 1700 03/27/21 2303  HGB 12.5  --   --   --   --   --   --   HCT 38.4  --   --   --   --   --   --   PLT 279  --   --   --   --   --   --   APTT  --   --   --  98*  --   --   --   LABPROT  --   --   --  13.7  --   --   --   INR  --   --   --  1.1  --   --   --   HEPARINUNFRC  --   --   --   --   --  0.35 0.33  CREATININE 0.95  --   --   --   --   --   --   TROPONINIHS 36*   < > 199* 231* 212*  --   --    < > = values in this interval not displayed.     Estimated Creatinine Clearance: 66.4 mL/min (by C-G formula based on SCr of 0.95 mg/dL).   Medical History: Past Medical History:  Diagnosis Date   Anemia    Chronic diastolic (congestive) heart failure (HCC) 2017   GRADE 2-saw [facility] in 2020-he states for pt to just f/u as needed   Complication of anesthesia    Family history of adverse reaction to anesthesia    DAUGHTER-N/V   GERD (gastroesophageal reflux disease)    History of kidney stones    H/O   Hypertension    PONV (postoperative nausea and vomiting)    N/V   Scoliosis    Vertigo 2012   X1    Medications:  (Not in a hospital admission) Scheduled:  Infusions:  PRN:  Anti-infectives (From admission, onward)    None       Assessment: Pharmacy consulted to start heparin for ACS. Troponin elevated. Denies chest pain. No DOAC PTA. Baseline labs ordered.   11/13 1700 HL 0.35  11/13 2303 HL 0.33, therapeutic X 2    Goal of Therapy:  Heparin level 0.3-0.7 units/ml Monitor platelets by anticoagulation protocol: Yes   Plan:  11/13:  HL @ 2303 = 0.33, therapeutic X 2 Will continue pt on current rate and recheck HL on 11/14 with AM labs.   Gerrad Welker D, PharmD 03/28/2021,12:03 AM

## 2021-03-28 NOTE — ED Notes (Signed)
Pt to c pod

## 2021-03-28 NOTE — Discharge Summary (Signed)
[facility] [place] [facility]   PATIENT NAME: Stephanie Cooper    MR#:  [id number]  DATE OF BIRTH:  [birth year]  DATE OF ADMISSION:  03/27/2021 ADMITTING PHYSICIAN: Lucile Shutters, MD  DATE OF DISCHARGE: 03/28/2021  1:55 PM  PRIMARY CARE PHYSICIAN: Lynnea Ferrier, MD    ADMISSION DIAGNOSIS:  Palpitations [R00.2] NSTEMI (non-ST elevated myocardial infarction) (HCC) [I21.4]  DISCHARGE DIAGNOSIS:  Elevated troponin secondary to fast heart rate Paroxysmal atrial fibrillation  SECONDARY DIAGNOSIS:   Past Medical History:  Diagnosis Date   Anemia    Chronic diastolic (congestive) heart failure (HCC) 2017   GRADE 2-saw [facility] in 2020-he states for pt to just f/u as needed   Complication of anesthesia    Family history of adverse reaction to anesthesia    DAUGHTER-N/V   GERD (gastroesophageal reflux disease)    History of kidney stones    H/O   Hypertension    PONV (postoperative nausea and vomiting)    N/V   Scoliosis    Vertigo 2012   X1    HOSPITAL COURSE:   Paroxysmal atrial fibrillation.  The patient noticed on her watch that she was in A. fib and had a heart rate of 173.  She normally takes Toprol XL at home.  By the time she came to the hospital she was in sinus rhythm.  Cardiology switched Toprol over to verapamil sustained release.  Eliquis 5 mg twice daily prescribed.  30-day discount card given from the cardiac Cath Lab.  Benefits and risks of Eliquis explained to the patient.  Follow-up with cardiology as outpatient and consider an event monitor. Elevated troponin.  This is likely demand ischemia from fast heart rate.  Normal coronary arteries seen on cardiac cath. 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  Follow-up with cardiology as outpatient Essential hypertension on Cozaar and now verapamil GERD on Pepcid  DISCHARGE CONDITIONS:   Satisfactory  CONSULTS OBTAINED:  Cardiology  DRUG ALLERGIES:    Allergies  Allergen Reactions   Lisinopril Cough   Prednisone Other (See Comments)    Elevated blood pressure Felt like weight on her chest    DISCHARGE MEDICATIONS:   Allergies as of 03/28/2021       Reactions   Lisinopril Cough   Prednisone Other (See Comments)   Elevated blood pressure Felt like weight on her chest        Medication List     STOP taking these medications    alendronate 70 MG tablet Commonly known as: FOSAMAX   aspirin EC 81 MG tablet   atorvastatin 40 MG tablet Commonly known as: LIPITOR   metoprolol succinate 25 MG 24 hr tablet Commonly known as: Toprol XL       TAKE these medications    apixaban 5 MG Tabs tablet Commonly known as: ELIQUIS Take 1 tablet (5 mg total) by mouth 2 (two) times daily.   Calcium Carbonate-Vitamin D 600-400 MG-UNIT tablet Take 1 tablet by mouth daily with breakfast.   famotidine 20 MG tablet Commonly known as: PEPCID Take 20 mg by mouth 2 (two) times daily.   losartan 25 MG tablet Commonly known as: COZAAR Take 25 mg by mouth every morning.   montelukast 10 MG tablet Commonly known as: SINGULAIR Take 10 mg by mouth every evening.   verapamil 120 MG CR tablet Commonly known as: CALAN-SR Take 1 tablet (120 mg total) by mouth daily.         DISCHARGE INSTRUCTIONS:  Follow-up PMD 5 days Follow-up cardiology 1 week  If you experience worsening of your admission symptoms, develop shortness of breath, life threatening emergency, suicidal or homicidal thoughts you must seek medical attention immediately by calling 911 or calling your MD immediately  if symptoms less severe.  You Must read complete instructions/literature along with all the possible adverse reactions/side effects for all the Medicines you take and that have been prescribed to you. Take any new Medicines after you have completely understood and accept all the possible adverse reactions/side effects.   Please note  You were cared  for by a hospitalist during your hospital stay. If you have any questions about your discharge medications or the care you received while you were in the hospital after you are discharged, you can call the unit and asked to speak with the hospitalist on call if the hospitalist that took care of you is not available. Once you are discharged, your primary care physician will handle any further medical issues. Please note that NO REFILLS for any discharge medications will be authorized once you are discharged, as it is imperative that you return to your primary care physician (or establish a relationship with a primary care physician if you do not have one) for your aftercare needs so that they can reassess your need for medications and monitor your lab values.    Today   CHIEF COMPLAINT:   Chief Complaint  Patient presents with   Tachycardia   Chest Pain    HISTORY OF PRESENT ILLNESS:  Stephanie Cooper  is a 65 y.o. female w came in with tachycardia and chest discomfort   VITAL SIGNS:  Blood pressure (!) 106/58, pulse 71, temperature 98.2 F (36.8 C), temperature source Oral, resp. rate 20, height 5\' 7"  (1.702 m), weight 90.7 kg, SpO2 99 %.  I/O:   Intake/Output Summary (Last 24 hours) at 03/28/2021 1546 Last data filed at 03/28/2021 0721 Gross per 24 hour  Intake 265.52 ml  Output --  Net 265.52 ml    PHYSICAL EXAMINATION:  GENERAL:  65 y.o.-year-old patient lying in the bed with no acute distress.  EYES: Pupils equal, round, reactive to light and accommodation. No scleral icterus.  HEENT: Head atraumatic, normocephalic. Oropharynx and nasopharynx clear.  LUNGS: Normal breath sounds bilaterally, no wheezing, rales,rhonchi or crepitation. No use of accessory muscles of respiration.  CARDIOVASCULAR: S1, S2 normal. No murmurs, rubs, or gallops.  ABDOMEN: Soft, non-tender, non-distended. Bowel sounds present. No organomegaly or mass.  EXTREMITIES: No pedal edema.  NEUROLOGIC:  Cranial nerves II through XII are intact. Muscle strength 5/5 in all extremities. Sensation intact. Gait not checked.  PSYCHIATRIC: The patient is alert and oriented x 3.  SKIN: No obvious rash, lesion, or ulcer.   DATA REVIEW:   CBC Recent Labs  Lab 03/28/21 0604  WBC 6.6  HGB 11.3*  HCT 35.6*  PLT 254    Chemistries  Recent Labs  Lab 03/27/21 0235 03/28/21 0604  NA 139 137  K 3.6 3.5  CL 106 107  CO2 28 25  GLUCOSE 163* 111*  BUN 20 16  CREATININE 0.95 0.79  CALCIUM 9.6 8.2*  MG 1.9  --   AST 21  --   ALT 13  --   ALKPHOS 57  --   BILITOT 0.8  --      Microbiology Results  Results for orders placed or performed during the hospital encounter of 03/27/21  Resp Panel by RT-PCR (Flu A&B, Covid) Nasopharyngeal Swab  Status: None   Collection Time: 03/27/21  1:14 PM   Specimen: Nasopharyngeal Swab; Nasopharyngeal(NP) swabs in vial transport medium  Result Value Ref Range Status   SARS Coronavirus 2 by RT PCR NEGATIVE NEGATIVE Final    Comment: (NOTE) SARS-CoV-2 target nucleic acids are NOT DETECTED.  The SARS-CoV-2 RNA is generally detectable in upper respiratory specimens during the acute phase of infection. The lowest concentration of SARS-CoV-2 viral copies this assay can detect is 138 copies/mL. A negative result does not preclude SARS-Cov-2 infection and should not be used as the sole basis for treatment or other patient management decisions. A negative result may occur with  improper specimen collection/handling, submission of specimen other than nasopharyngeal swab, presence of viral mutation(s) within the areas targeted by this assay, and inadequate number of viral copies(<138 copies/mL). A negative result must be combined with clinical observations, patient history, and epidemiological information. The expected result is Negative.  Fact Sheet for Patients:  BloggerCourse.com  Fact Sheet for Healthcare Providers:   SeriousBroker.it  This test is no t yet approved or cleared by the [place] FDA and  has been authorized for detection and/or diagnosis of SARS-CoV-2 by FDA under an Emergency Use Authorization (EUA). This EUA will remain  in effect (meaning this test can be used) for the duration of the COVID-19 declaration under Section 564(b)(1) of the Act, 21 U.S.C.section 360bbb-3(b)(1), unless the authorization is terminated  or revoked sooner.       Influenza A by PCR NEGATIVE NEGATIVE Final   Influenza B by PCR NEGATIVE NEGATIVE Final    Comment: (NOTE) The Xpert Xpress SARS-CoV-2/FLU/RSV plus assay is intended as an aid in the diagnosis of influenza from Nasopharyngeal swab specimens and should not be used as a sole basis for treatment. Nasal washings and aspirates are unacceptable for Xpert Xpress SARS-CoV-2/FLU/RSV testing.  Fact Sheet for Patients: BloggerCourse.com  Fact Sheet for Healthcare Providers: SeriousBroker.it  This test is not yet approved or cleared by the [place] FDA and has been authorized for detection and/or diagnosis of SARS-CoV-2 by FDA under an Emergency Use Authorization (EUA). This EUA will remain in effect (meaning this test can be used) for the duration of the COVID-19 declaration under Section 564(b)(1) of the Act, 21 U.S.C. section 360bbb-3(b)(1), unless the authorization is terminated or revoked.  Performed at [facility], [address] [place] [place], [place] [address]     RADIOLOGY:  DG Chest 2 View  Result Date: 03/27/2021 CLINICAL DATA:  65 year old female with chest pain. EXAM: CHEST - 2 VIEW COMPARISON:  08/18/2015 FINDINGS: The mediastinal contours are within normal limits. No cardiomegaly. The lungs are clear bilaterally without evidence of focal consolidation, pleural effusion, or pneumothorax. No acute osseous abnormality. Similar appearing sigmoid  curvature of the thoracolumbar spine. IMPRESSION: No acute cardiopulmonary process. Electronically Signed   By: Marliss Coots M.D.   On: 03/27/2021 09:50   CARDIAC CATHETERIZATION  Result Date: 03/28/2021   There is hyperdynamic left ventricular systolic function.   LV end diastolic pressure is mildly elevated.   There is moderate (3+) mitral regurgitation. 65 year old female with hypertension hyperlipidemia having acute onset of atrial fibrillation with rapid ventricular rate and chest pain and pressure with elevated troponin Echocardiogram showing hyperdynamic LV systolic function with ejection fraction 75% and creased subaortic velocity consistent with hypertrophic obstructive cardiomyopathy with greater than 4 m/s and no evidence of aortic valve stenosis Left ventricle hemodynamic showing and confirming above Normal coronary arteries with 0% stenosis Plan Continue medication  management for further risk reduction of worsening subaortic and/or hypertrophic obstructive cardiomyopathy with verapamil 120 mg each day and increase as necessary for improved symptoms and reducing the possibility of diastolic dysfunction No further cardiac intervention at this time Treatment of atrial fibrillation with above and anticoagulation for 1 month and possible discontinuation thereafter if no further recurrence   ECHOCARDIOGRAM COMPLETE  Result Date: 03/28/2021    ECHOCARDIOGRAM REPORT   Patient Name:   Stephanie Cooper Date of Exam: 03/28/2021 Medical Rec #:  [id number]         Height:       67.0 in Accession #:    [id number]        Weight:       200.0 lb Date of Birth:  [birth year]          BSA:          2.022 m Patient Age:    65 years          BP:           126/62 mmHg Patient Gender: F                 HR:           58 bpm. Exam Location:  [facility] Procedure: 2D Echo, Cardiac Doppler and Color Doppler Indications:     NSTEMI I21.4  History:         Patient has prior history of Echocardiogram examinations, most                   recent 08/18/2015. CHF; Risk Factors:Hypertension.  Sonographer:     Cristela Blue Referring Phys:  [id number] Diagnosing Phys: Arnoldo Hooker MD IMPRESSIONS  1. Aympetric septal hypertrophy with increased LVOT velocities and moderate SAM. Left ventricular ejection fraction, by estimation, is 70 to 75%. The left ventricle has hyperdynamic function. The left ventricle has no regional wall motion abnormalities.  There is moderate left ventricular hypertrophy. Left ventricular diastolic parameters are consistent with Grade II diastolic dysfunction (pseudonormalization).  2. Right ventricular systolic function is normal. The right ventricular size is normal.  3. Left atrial size was mildly dilated.  4. The mitral valve is normal in structure. Moderate mitral valve regurgitation.  5. The aortic valve is normal in structure. Aortic valve regurgitation is not visualized. FINDINGS  Left Ventricle: Aympetric septal hypertrophy with increased LVOT velocities and moderate SAM. Left ventricular ejection fraction, by estimation, is 70 to 75%. The left ventricle has hyperdynamic function. The left ventricle has no regional wall motion abnormalities. The left ventricular internal cavity size was normal in size. There is moderate left ventricular hypertrophy. Left ventricular diastolic parameters are consistent with Grade II diastolic dysfunction (pseudonormalization). Right Ventricle: The right ventricular size is normal. No increase in right ventricular wall thickness. Right ventricular systolic function is normal. Left Atrium: Left atrial size was mildly dilated. Right Atrium: Right atrial size was normal in size. Pericardium: There is no evidence of pericardial effusion. Mitral Valve: The mitral valve is normal in structure. Moderate mitral valve regurgitation. MV peak gradient, 9.7 mmHg. The mean mitral valve gradient is 3.0 mmHg. Tricuspid Valve: The tricuspid valve is normal in structure. Tricuspid valve  regurgitation is mild. Aortic Valve: The aortic valve is normal in structure. Aortic valve regurgitation is not visualized. Aortic valve mean gradient measures 14.7 mmHg. Aortic valve peak gradient measures 33.3 mmHg. Aortic valve area, by VTI measures 6.00 cm. Pulmonic Valve: The pulmonic valve was normal in  structure. Pulmonic valve regurgitation is not visualized. Aorta: The aortic root and ascending aorta are structurally normal, with no evidence of dilitation. IAS/Shunts: No atrial level shunt detected by color flow Doppler.  LEFT VENTRICLE PLAX 2D LVIDd:         4.10 cm   Diastology LVIDs:         2.10 cm   LV e' medial:    4.13 cm/s LV PW:         1.90 cm   LV E/e' medial:  24.2 LV IVS:        1.40 cm   LV e' lateral:   3.37 cm/s LVOT diam:     2.00 cm   LV E/e' lateral: 29.6 LV SV:         324 LV SV Index:   160 LVOT Area:     3.14 cm  RIGHT VENTRICLE RV Basal diam:  4.10 cm RV S prime:     11.40 cm/s LEFT ATRIUM             Index        RIGHT ATRIUM           Index LA diam:        4.90 cm 2.42 cm/m   RA Area:     23.40 cm LA Vol (A2C):   49.2 ml 24.33 ml/m  RA Volume:   79.80 ml  39.46 ml/m LA Vol (A4C):   82.0 ml 40.55 ml/m LA Biplane Vol: 66.8 ml 33.03 ml/m  AORTIC VALVE                     PULMONIC VALVE AV Area (Vmax):    5.77 cm      PV Vmax:        0.65 m/s AV Area (Vmean):   6.02 cm      PV Vmean:       38.700 cm/s AV Area (VTI):     6.00 cm      PV VTI:         0.106 m AV Vmax:           288.33 cm/s   PV Peak grad:   1.7 mmHg AV Vmean:          164.333 cm/s  PV Mean grad:   1.0 mmHg AV VTI:            0.540 m       RVOT Peak grad: 5 mmHg AV Peak Grad:      33.3 mmHg AV Mean Grad:      14.7 mmHg LVOT Vmax:         530.00 cm/s LVOT Vmean:        315.000 cm/s LVOT VTI:          1.030 m LVOT/AV VTI ratio: 1.91  AORTA Ao Root diam: 3.14 cm MITRAL VALVE                TRICUSPID VALVE MV Area (PHT): 2.32 cm     TR Peak grad:   93.7 mmHg MV Area VTI:   7.17 cm     TR Vmax:        484.00 cm/s MV  Peak grad:  9.7 mmHg MV Mean grad:  3.0 mmHg     SHUNTS MV Vmax:       1.56 m/s     Systemic VTI:  1.03 m MV Vmean:      82.3 cm/s  Systemic Diam: 2.00 cm MV Decel Time: 327 msec     Pulmonic VTI:  0.237 m MV E velocity: 99.80 cm/s MV A velocity: 111.00 cm/s MV E/A ratio:  0.90 Arnoldo Hooker MD Electronically signed by Arnoldo Hooker MD Signature Date/Time: 03/28/2021/9:37:57 AM    Final       Management plans discussed with the patient, and she is in agreement.  CODE STATUS:     Code Status Orders  (From admission, onward)           Start     Ordered   03/27/21 1010  Full code  Continuous        03/27/21 1011           Code Status History     Date Active Date Inactive Code Status Order ID Comments User Context   12/05/2019 0959 12/05/2019 1843 Full Code [id number]  Schermerhorn, Ihor Austin, MD Inpatient   08/18/2015 1055 08/19/2015 1757 Full Code [id number]  Altamese Dilling, MD ED       TOTAL TIME TAKING CARE OF THIS PATIENT: 35 minutes.    Alford Highland M.D on 03/28/2021 at 3:46 PM   [facility]  CC: Primary care physician; Lynnea Ferrier, MD

## 2021-03-28 NOTE — Progress Notes (Signed)
Patient to cath lab.

## 2022-02-09 ENCOUNTER — Other Ambulatory Visit: Payer: Self-pay | Admitting: Internal Medicine

## 2022-02-09 DIAGNOSIS — R310 Gross hematuria: Secondary | ICD-10-CM

## 2022-02-14 ENCOUNTER — Other Ambulatory Visit: Payer: Self-pay | Admitting: Internal Medicine

## 2022-02-14 DIAGNOSIS — Z1231 Encounter for screening mammogram for malignant neoplasm of breast: Secondary | ICD-10-CM

## 2022-02-15 ENCOUNTER — Ambulatory Visit
Admission: RE | Admit: 2022-02-15 | Discharge: 2022-02-15 | Disposition: A | Discharge status: Home / Self Care | Payer: BC Managed Care – PPO | Source: Ambulatory Visit | Attending: Internal Medicine | Admitting: Internal Medicine

## 2022-02-15 DIAGNOSIS — Z1231 Encounter for screening mammogram for malignant neoplasm of breast: Secondary | ICD-10-CM

## 2022-02-22 ENCOUNTER — Ambulatory Visit
Admission: RE | Admit: 2022-02-22 | Discharge: 2022-02-22 | Disposition: A | Discharge status: Home / Self Care | Payer: BC Managed Care – PPO | Source: Ambulatory Visit | Attending: Internal Medicine | Admitting: Internal Medicine

## 2022-02-22 DIAGNOSIS — R310 Gross hematuria: Secondary | ICD-10-CM | POA: Insufficient documentation

## 2022-03-08 ENCOUNTER — Ambulatory Visit: Payer: BC Managed Care – PPO | Admitting: Urology

## 2022-03-08 ENCOUNTER — Encounter: Payer: Self-pay | Admitting: Urology

## 2022-03-08 VITALS — BP 142/86 | HR 91 | Ht 67.0 in | Wt 201.0 lb

## 2022-03-08 DIAGNOSIS — N200 Calculus of kidney: Secondary | ICD-10-CM | POA: Diagnosis not present

## 2022-03-08 DIAGNOSIS — R310 Gross hematuria: Secondary | ICD-10-CM | POA: Diagnosis not present

## 2022-03-08 LAB — URINALYSIS, COMPLETE
Bilirubin, UA: NEGATIVE
Glucose, UA: NEGATIVE
Ketones, UA: NEGATIVE
Nitrite, UA: NEGATIVE
Specific Gravity, UA: 1.02 (ref 1.005–1.030)
Urobilinogen, Ur: 0.2 mg/dL (ref 0.2–1.0)
pH, UA: 7 (ref 5.0–7.5)

## 2022-03-08 LAB — MICROSCOPIC EXAMINATION: WBC, UA: >30 /hpf — AB (ref 0–5)

## 2022-03-08 NOTE — H&P (View-Only) (Signed)
 03/08/2022 10:21 AM   Stephanie Cooper 03/04/1956 [id number]  Referring provider: Klein, Bert J III, MD [address] [facility] [place],  [place] [address]  Chief Complaint  Patient presents with   New Patient (Initial Visit)   Nephrolithiasis    HPI: 66-year-old female who presents today for further evaluation of gross hematuria and a large staghorn calculus with hydronephrosis.  She reports that she has been having some intermittent gross hematuria which is very worrisome to her.  She was seen and evaluated by her PCP, Dr. Klein and underwent a CT stone protocol on 02/23/2022.  CT scan indicated left renal atrophy with cortical thinning and dilated upper pole cystoscopy with greater than 3.2 cm left staghorn calculus.  She has had abnormal urinalyses for years including with blood.  She does have some dull left flank pain she thought was related to her back.  UA today with >30 WBC, 11-30 RBC, moderate bacteria, nitrite negative.  No dysuria or gross hematuria.  No fevers or chills.  She does have a personal history of nephrolithiasis and had a shockwave lithotripsy several decades ago.   PMH: Past Medical History:  Diagnosis Date   Anemia    Chronic diastolic (congestive) heart failure (HCC) 2017   GRADE 2-saw callwood in 2020-he states for pt to just f/u as needed   Complication of anesthesia    Family history of adverse reaction to anesthesia    DAUGHTER-N/V   GERD (gastroesophageal reflux disease)    History of kidney stones    H/O   Hypertension    PONV (postoperative nausea and vomiting)    N/V   Scoliosis    Vertigo 2012   X1    Surgical History: Past Surgical History:  Procedure Laterality Date   DIAGNOSTIC LAPAROSCOPY  1990   DILATATION & CURETTAGE/HYSTEROSCOPY WITH MYOSURE N/A 12/05/2019   Procedure: FRACTIONAL DILATATION & CURETTAGE/HYSTEROSCOPY WITH MYOSURE;  Surgeon: Schermerhorn, Thomas J, MD;  Location: [facility] ORS;  Service:  Gynecology;  Laterality: N/A;   LEFT HEART CATH AND CORONARY ANGIOGRAPHY N/A 03/28/2021   Procedure: LEFT HEART CATH AND CORONARY ANGIOGRAPHY;  Surgeon: Kowalski, Bruce J, MD;  Location: [facility] INVASIVE CV LAB;  Service: Cardiovascular;  Laterality: N/A;   LITHOTRIPSY      Home Medications:  Allergies as of 03/08/2022       Reactions   Lisinopril Cough   Prednisone Other (See Comments)   Elevated blood pressure Felt like weight on her chest        Medication List        Accurate as of March 08, 2022 11:59 PM. If you have any questions, ask your nurse or doctor.          STOP taking these medications    Calcium Carbonate-Vitamin D 600-400 MG-UNIT tablet Stopped by: Isa Kohlenberg, MD   verapamil 120 MG CR tablet Commonly known as: CALAN-SR Stopped by: Lajoy Vanamburg, MD       TAKE these medications    apixaban 5 MG Tabs tablet Commonly known as: ELIQUIS Take 1 tablet (5 mg total) by mouth 2 (two) times daily.   famotidine 20 MG tablet Commonly known as: PEPCID Take 20 mg by mouth 2 (two) times daily.   losartan 25 MG tablet Commonly known as: COZAAR Take 25 mg by mouth every morning.   metoprolol succinate 25 MG 24 hr tablet Commonly known as: TOPROL-XL Take 25 mg by mouth daily.   montelukast 10 MG tablet Commonly known   as: SINGULAIR Take 10 mg by mouth every evening.        Allergies:  Allergies  Allergen Reactions   Lisinopril Cough   Prednisone Other (See Comments)    Elevated blood pressure Felt like weight on her chest    Family History: Family History  Problem Relation Age of Onset   Diverticulitis Mother    Atrial fibrillation Mother    Diabetes Father    Breast cancer Neg Hx     Social History:  reports that she has never smoked. She has never used smokeless tobacco. She reports current alcohol use. She reports that she does not use drugs.   Physical Exam: BP (!) 142/86   Pulse 91   Ht 5\' 7"  (1.702 m)   Wt 201 lb (91.2  kg)   BMI 31.48 kg/m   Constitutional:  Alert and oriented, No acute distress. HEENT: [place] AT, moist mucus membranes.  Trachea midline, no masses. Cardiovascular: No clubbing, cyanosis, or edema. Respiratory: Normal respiratory effort, no increased work of breathing. GI: Abdomen is soft, nontender, nondistended, no abdominal masses GU: No CVA tenderness Skin: No rashes, bruises or suspicious lesions. Neurologic: Grossly intact, no focal deficits, moving all 4 extremities. Psychiatric: Normal mood and affect.  Laboratory Data: Lab Results  Component Value Date   WBC 6.6 03/28/2021   HGB 11.3 (L) 03/28/2021   HCT 35.6 (L) 03/28/2021   MCV 87.9 03/28/2021   PLT 254 03/28/2021    Lab Results  Component Value Date   CREATININE 0.79 03/28/2021    Lab Results  Component Value Date   HGBA1C 5.9 08/18/2015    Urinalysis    Component Value Date/Time   APPEARANCEUR Hazy (A) 03/08/2022 1455   GLUCOSEU Negative 03/08/2022 1455   BILIRUBINUR Negative 03/08/2022 1455   PROTEINUR Trace (A) 03/08/2022 1455   NITRITE Negative 03/08/2022 1455   LEUKOCYTESUR 2+ (A) 03/08/2022 1455    Lab Results  Component Value Date   LABMICR See below: 03/08/2022   WBCUA >30 (A) 03/08/2022   LABEPIT 0-10 03/08/2022   BACTERIA Moderate (A) 03/08/2022    Pertinent Imaging: CT RENAL STONE STUDY  Narrative CLINICAL DATA:  Gross hematuria for 3 weeks, history of kidney stones  EXAM: CT ABDOMEN AND PELVIS WITHOUT CONTRAST  TECHNIQUE: Multidetector CT imaging of the abdomen and pelvis was performed following the standard protocol without IV contrast.  RADIATION DOSE REDUCTION: This exam was performed according to the departmental dose-optimization program which includes automated exposure control, adjustment of the mA and/or kV according to patient size and/or use of iterative reconstruction technique.  COMPARISON:  09/17/2009  FINDINGS: Lower chest: Lung bases emphysematous but clear.  Minimal enlargement of cardiac chambers. Small pericardial effusion.  Hepatobiliary: Gallbladder and liver normal appearance  Pancreas: Normal appearance  Spleen: Normal appearance  Adrenals/Urinary Tract: Adrenal glands normal appearance. Tiny calculus at inferior pole RIGHT kidney, RIGHT kidney otherwise normal appearance. LEFT kidney atrophic with cortical thinning and an upper pole cyst 2.7 x 2.2 cm. Multiple LEFT renal calculi including staghorn calculus 3.2 x 3.1 x 2.9 cm. Mild dilatation of upper pole collecting system LEFT kidney. No renal pelvic dilatation. No hydroureter. No ureteral calcifications. Bladder unremarkable.  Stomach/Bowel: Normal appendix. Moderate-sized hiatal hernia. Stomach and bowel loops otherwise normal appearance. No bowel wall thickening, dilatation or obstruction.  Vascular/Lymphatic: Few pelvic phleboliths. Aorta normal caliber. No adenopathy.  Reproductive: Atrophic uterus and ovaries  Other: No free air or free fluid. No hernia. No inflammatory process.  Musculoskeletal: Osseous  demineralization. Degenerative changes and levoconvex scoliosis of thoracolumbar spine.  IMPRESSION: Atrophic LEFT kidney with cortical thinning and an upper pole cyst 2.7 x 2.2 cm.  Multiple LEFT renal calculi including staghorn calculus 3.2 x 3.1 x 2.9 cm associated with mild dilatation of upper pole collecting system LEFT kidney.  Tiny nonobstructing RIGHT renal calculus.  Moderate-sized hiatal hernia.  Small pericardial effusion.  No acute intra-abdominal or intrapelvic abnormalities.   Electronically Signed By: Mark  Boles M.D. On: 02/23/2022 08:44  The above CT scan was reviewed personally today and also was able to share the images with Ms. Denomme.  Assessment & Plan:    1. Left nephrolithiasis Large left staghorn calculus with upper pole obstruction  We discussed that although there is some renal atrophy on the side, there is enough  parenchymal that repeating the stone would leave the salvageable kidney.  Specifically today, we discussed ureteroscopy versus PCNL.  We discussed the stone free rates with each, certainly if we proceed with PCNL, much higher stone free clearance rate however more invasive.  Could also consider staged ureteroscopy's which would require several procedures likely to clear all stone burden.  We discussed the risk and benefits of each along with the risk of bleeding, infection, damage surrounding structures, need for further procedures, need for stent amongst others.  Ultimately, she believes that she wants to proceed with staged ureteroscopy  She had a cardiac clearance to hold Eliquis. - Urinalysis, Complete - CULTURE, URINE COMPREHENSIVE  2. Gross hematuria Likely secondary to #2; cysto at the time of #1    Keandra Medero, MD  [facility] [address], [address] [place], [place] [address] [phone]  

## 2022-03-08 NOTE — Progress Notes (Signed)
03/08/2022 10:21 AM   Luanne Bras 19-Jan-1956 [id number]  Referring provider: Lynnea Ferrier, MD [address] [facility] [place],  [place] [address]  Chief Complaint  Patient presents with   New Patient (Initial Visit)   Nephrolithiasis    HPI: 66 year old female who presents today for further evaluation of gross hematuria and a large staghorn calculus with hydronephrosis.  She reports that she has been having some intermittent gross hematuria which is very worrisome to her.  She was seen and evaluated by her PCP, Dr. Graciela Husbands and underwent a CT stone protocol on 02/23/2022.  CT scan indicated left renal atrophy with cortical thinning and dilated upper pole cystoscopy with greater than 3.2 cm left staghorn calculus.  She has had abnormal urinalyses for years including with blood.  She does have some dull left flank pain she thought was related to her back.  UA today with >30 WBC, 11-30 RBC, moderate bacteria, nitrite negative.  No dysuria or gross hematuria.  No fevers or chills.  She does have a personal history of nephrolithiasis and had a shockwave lithotripsy several decades ago.   PMH: Past Medical History:  Diagnosis Date   Anemia    Chronic diastolic (congestive) heart failure (HCC) 2017   GRADE 2-saw [place]wood in 2020-he states for pt to just f/u as needed   Complication of anesthesia    Family history of adverse reaction to anesthesia    DAUGHTER-N/V   GERD (gastroesophageal reflux disease)    History of kidney stones    H/O   Hypertension    PONV (postoperative nausea and vomiting)    N/V   Scoliosis    Vertigo 2012   X1    Surgical History: Past Surgical History:  Procedure Laterality Date   DIAGNOSTIC LAPAROSCOPY  1990   DILATATION & CURETTAGE/HYSTEROSCOPY WITH MYOSURE N/A 12/05/2019   Procedure: FRACTIONAL DILATATION & CURETTAGE/HYSTEROSCOPY WITH MYOSURE;  Surgeon: Schermerhorn, Ihor Austin, MD;  Location: [facility] ORS;  Service:  Gynecology;  Laterality: N/A;   LEFT HEART CATH AND CORONARY ANGIOGRAPHY N/A 03/28/2021   Procedure: LEFT HEART CATH AND CORONARY ANGIOGRAPHY;  Surgeon: Lamar Blinks, MD;  Location: [facility] INVASIVE CV LAB;  Service: Cardiovascular;  Laterality: N/A;   LITHOTRIPSY      Home Medications:  Allergies as of 03/08/2022       Reactions   Lisinopril Cough   Prednisone Other (See Comments)   Elevated blood pressure Felt like weight on her chest        Medication List        Accurate as of March 08, 2022 11:59 PM. If you have any questions, ask your nurse or doctor.          STOP taking these medications    Calcium Carbonate-Vitamin D 600-400 MG-UNIT tablet Stopped by: Vanna Scotland, MD   verapamil 120 MG CR tablet Commonly known as: CALAN-SR Stopped by: Vanna Scotland, MD       TAKE these medications    apixaban 5 MG Tabs tablet Commonly known as: ELIQUIS Take 1 tablet (5 mg total) by mouth 2 (two) times daily.   famotidine 20 MG tablet Commonly known as: PEPCID Take 20 mg by mouth 2 (two) times daily.   losartan 25 MG tablet Commonly known as: COZAAR Take 25 mg by mouth every morning.   metoprolol succinate 25 MG 24 hr tablet Commonly known as: TOPROL-XL Take 25 mg by mouth daily.   montelukast 10 MG tablet Commonly known  as: SINGULAIR Take 10 mg by mouth every evening.        Allergies:  Allergies  Allergen Reactions   Lisinopril Cough   Prednisone Other (See Comments)    Elevated blood pressure Felt like weight on her chest    Family History: Family History  Problem Relation Age of Onset   Diverticulitis Mother    Atrial fibrillation Mother    Diabetes Father    Breast cancer Neg Hx     Social History:  reports that she has never smoked. She has never used smokeless tobacco. She reports current alcohol use. She reports that she does not use drugs.   Physical Exam: BP (!) 142/86   Pulse 91   Ht 5\' 7"  (1.702 m)   Wt 201 lb (91.2  kg)   BMI 31.48 kg/m   Constitutional:  Alert and oriented, No acute distress. HEENT: [place] AT, moist mucus membranes.  Trachea midline, no masses. Cardiovascular: No clubbing, cyanosis, or edema. Respiratory: Normal respiratory effort, no increased work of breathing. GI: Abdomen is soft, nontender, nondistended, no abdominal masses GU: No CVA tenderness Skin: No rashes, bruises or suspicious lesions. Neurologic: Grossly intact, no focal deficits, moving all 4 extremities. Psychiatric: Normal mood and affect.  Laboratory Data: Lab Results  Component Value Date   WBC 6.6 03/28/2021   HGB 11.3 (L) 03/28/2021   HCT 35.6 (L) 03/28/2021   MCV 87.9 03/28/2021   PLT 254 03/28/2021    Lab Results  Component Value Date   CREATININE 0.79 03/28/2021    Lab Results  Component Value Date   HGBA1C 5.9 08/18/2015    Urinalysis    Component Value Date/Time   APPEARANCEUR Hazy (A) 03/08/2022 1455   GLUCOSEU Negative 03/08/2022 1455   BILIRUBINUR Negative 03/08/2022 1455   PROTEINUR Trace (A) 03/08/2022 1455   NITRITE Negative 03/08/2022 1455   LEUKOCYTESUR 2+ (A) 03/08/2022 1455    Lab Results  Component Value Date   LABMICR See below: 03/08/2022   WBCUA >30 (A) 03/08/2022   LABEPIT 0-10 03/08/2022   BACTERIA Moderate (A) 03/08/2022    Pertinent Imaging: CT RENAL STONE STUDY  Narrative CLINICAL DATA:  Gross hematuria for 3 weeks, history of kidney stones  EXAM: CT ABDOMEN AND PELVIS WITHOUT CONTRAST  TECHNIQUE: Multidetector CT imaging of the abdomen and pelvis was performed following the standard protocol without IV contrast.  RADIATION DOSE REDUCTION: This exam was performed according to the departmental dose-optimization program which includes automated exposure control, adjustment of the mA and/or kV according to patient size and/or use of iterative reconstruction technique.  COMPARISON:  09/17/2009  FINDINGS: Lower chest: Lung bases emphysematous but clear.  Minimal enlargement of cardiac chambers. Small pericardial effusion.  Hepatobiliary: Gallbladder and liver normal appearance  Pancreas: Normal appearance  Spleen: Normal appearance  Adrenals/Urinary Tract: Adrenal glands normal appearance. Tiny calculus at inferior pole RIGHT kidney, RIGHT kidney otherwise normal appearance. LEFT kidney atrophic with cortical thinning and an upper pole cyst 2.7 x 2.2 cm. Multiple LEFT renal calculi including staghorn calculus 3.2 x 3.1 x 2.9 cm. Mild dilatation of upper pole collecting system LEFT kidney. No renal pelvic dilatation. No hydroureter. No ureteral calcifications. Bladder unremarkable.  Stomach/Bowel: Normal appendix. Moderate-sized hiatal hernia. Stomach and bowel loops otherwise normal appearance. No bowel wall thickening, dilatation or obstruction.  Vascular/Lymphatic: Few pelvic phleboliths. Aorta normal caliber. No adenopathy.  Reproductive: Atrophic uterus and ovaries  Other: No free air or free fluid. No hernia. No inflammatory process.  Musculoskeletal: Osseous  demineralization. Degenerative changes and levoconvex scoliosis of thoracolumbar spine.  IMPRESSION: Atrophic LEFT kidney with cortical thinning and an upper pole cyst 2.7 x 2.2 cm.  Multiple LEFT renal calculi including staghorn calculus 3.2 x 3.1 x 2.9 cm associated with mild dilatation of upper pole collecting system LEFT kidney.  Tiny nonobstructing RIGHT renal calculus.  Moderate-sized hiatal hernia.  Small pericardial effusion.  No acute intra-abdominal or intrapelvic abnormalities.   Electronically Signed By: Ulyses Southward M.D. On: 02/23/2022 08:44  The above CT scan was reviewed personally today and also was able to share the images with Ms. Lisette Grinder.  Assessment & Plan:    1. Left nephrolithiasis Large left staghorn calculus with upper pole obstruction  We discussed that although there is some renal atrophy on the side, there is enough  parenchymal that repeating the stone would leave the salvageable kidney.  Specifically today, we discussed ureteroscopy versus PCNL.  We discussed the stone free rates with each, certainly if we proceed with PCNL, much higher stone free clearance rate however more invasive.  Could also consider staged ureteroscopy's which would require several procedures likely to clear all stone burden.  We discussed the risk and benefits of each along with the risk of bleeding, infection, damage surrounding structures, need for further procedures, need for stent amongst others.  Ultimately, she believes that she wants to proceed with staged ureteroscopy  She had a cardiac clearance to hold Eliquis. - Urinalysis, Complete - CULTURE, URINE COMPREHENSIVE  2. Gross hematuria Likely secondary to #2; cysto at the time of #1    Vanna Scotland, MD  [facility] [address] [place], [place] [address] [phone]

## 2022-03-10 ENCOUNTER — Other Ambulatory Visit: Payer: Self-pay | Admitting: Urology

## 2022-03-10 DIAGNOSIS — N200 Calculus of kidney: Secondary | ICD-10-CM

## 2022-03-10 LAB — CULTURE, URINE COMPREHENSIVE

## 2022-03-10 NOTE — Progress Notes (Unsigned)
Surgical Physician Order Form [facility] [place]  * Scheduling expectation : Next Available  *Length of Case: 2.5 hours  *Clearance needed: yes  *Anticoagulation Instructions: Hold all anticoagulants Eliquis  *Aspirin Instructions: Ok to continue Aspirin  *Post-op visit Date/Instructions:   TBD  *Diagnosis: Left Nephrolithiasis  *Procedure: left Ureteroscopy w/laser lithotripsy & stent placement (54492)   Additional orders: N/A  -Admit type: OUTpatient  -Anesthesia: General  -VTE Prophylaxis Standing Order SCD's       Other:   -Standing Lab Orders Per Anesthesia    Lab other: None  -Standing Test orders EKG/Chest x-ray per Anesthesia       Test other:   - Medications:  Ancef 2gm IV  -Other orders:  N/A

## 2022-03-16 ENCOUNTER — Telehealth: Payer: Self-pay

## 2022-03-16 NOTE — Progress Notes (Signed)
REQUEST FOR SURGICAL CLEARANCE       Date: Date: 03/16/22  Faxed to: Dr. Clayborn Bigness, MD/ [facility]  Surgeon: Dr. Hollice Espy, MD      Date of Surgery: 03/27/2022  Operation: Left Ureteroscopy with Laser Lithotripsy and Stent Placement    Anesthesia Type: General   Diagnosis: Left Nephrolithiasis  Patient Requires:   Cardiac / Vascular Clearance : Yes  Reason: Would like for patient to hold Eliquis, would this be okay and for how long should she hold?   Risk Assessment:    Low   []       Moderate   []     High   []           This patient is optimized for surgery  YES []       NO   []    I recommend further assessment/workup prior to surgery. YES []      NO  []   Appointment scheduled for: _______________________   Further recommendations: ____________________________________     Physician Signature:__________________________________   Printed Name: ________________________________________   Date: _________________

## 2022-03-16 NOTE — Telephone Encounter (Signed)
I spoke with Stephanie Cooper. We have discussed possible surgery dates and Monday November 13th, 2023 was agreed upon by all parties. Patient given information about surgery date, what to expect pre-operatively and post operatively.  We discussed that a Pre-Admission Testing office will be calling to set up the pre-op visit that will take place prior to surgery, and that these appointments are typically done over the phone with a Pre-Admissions RN.  Informed patient that our office will communicate any additional care to be provided after surgery. Patients questions or concerns were discussed during our call. Advised to call our office should there be any additional information, questions or concerns that arise. Patient verbalized understanding.

## 2022-03-16 NOTE — Progress Notes (Signed)
[facility] Posting Form   Surgery Date/Time: Date: 03/27/2022  Surgeon: Dr. Hollice Espy, MD  Surgery Location: Day Surgery  Inpt ( No  )   Outpt (Yes)   Obs ( No  )   Diagnosis: N20.0 Left Nephrolithiasis  -CPT: 81856  Surgery: Left Ureteroscopy with Laser Lithotripsy and Stent Placement  Stop Anticoagulations: Yes, would like for patient to Eliquis  Cardiac/Medical/Pulmonary Clearance needed: yes  Clearance needed from Dr: Octavio Manns Cards  Clearance request sent on: Date: 03/16/22  *Orders entered into EPIC  Date: 03/16/22   *Case booked in EPIC  Date: 03/10/2022  *Notified pt of Surgery: Date: 03/10/2022  PRE-OP UA & CX: no  *Placed into Prior Authorization Work [place] Date: 03/16/22   Assistant/laser/rep:No

## 2022-03-23 ENCOUNTER — Encounter
Admission: RE | Admit: 2022-03-23 | Discharge: 2022-03-23 | Disposition: A | Discharge status: Home / Self Care | Payer: BC Managed Care – PPO | Source: Ambulatory Visit | Attending: Urology | Admitting: Urology

## 2022-03-23 ENCOUNTER — Encounter: Payer: Self-pay | Admitting: Urology

## 2022-03-23 VITALS — Ht 67.0 in | Wt 200.6 lb

## 2022-03-23 DIAGNOSIS — I5032 Chronic diastolic (congestive) heart failure: Secondary | ICD-10-CM | POA: Diagnosis not present

## 2022-03-23 DIAGNOSIS — I214 Non-ST elevation (NSTEMI) myocardial infarction: Secondary | ICD-10-CM

## 2022-03-23 DIAGNOSIS — I340 Nonrheumatic mitral (valve) insufficiency: Secondary | ICD-10-CM

## 2022-03-23 DIAGNOSIS — Z01810 Encounter for preprocedural cardiovascular examination: Secondary | ICD-10-CM | POA: Insufficient documentation

## 2022-03-23 DIAGNOSIS — I110 Hypertensive heart disease with heart failure: Secondary | ICD-10-CM | POA: Diagnosis not present

## 2022-03-23 DIAGNOSIS — I10 Essential (primary) hypertension: Secondary | ICD-10-CM

## 2022-03-23 DIAGNOSIS — I480 Paroxysmal atrial fibrillation: Secondary | ICD-10-CM | POA: Insufficient documentation

## 2022-03-23 DIAGNOSIS — I421 Obstructive hypertrophic cardiomyopathy: Secondary | ICD-10-CM | POA: Diagnosis not present

## 2022-03-23 HISTORY — DX: Obstructive hypertrophic cardiomyopathy: I42.1

## 2022-03-23 HISTORY — DX: Paroxysmal atrial fibrillation: I48.0

## 2022-03-23 HISTORY — DX: Personal history of other diseases of the digestive system: Z87.19

## 2022-03-23 NOTE — Patient Instructions (Signed)
Your procedure is scheduled on:03-27-22 Monday Report to the Registration Desk on the 1st floor of the [facility].Then proceed to the 2nd floor Surgery Desk To find out your arrival time, please call [phone] between 1PM - 3PM on:03-24-22 Friday If your arrival time is 6:00 am, do not arrive prior to that time as the [facility] entrance doors do not open until 6:00 am.  REMEMBER: Instructions that are not followed completely may result in serious medical risk, up to and including death; or upon the discretion of your surgeon and anesthesiologist your surgery may need to be rescheduled.  Do not eat food OR drink any liquids after midnight the night before surgery.  No gum chewing, lozengers or hard candies.  TAKE THESE MEDICATIONS THE MORNING OF SURGERY WITH A SIP OF WATER: -famotidine (PEPCID)  -metoprolol succinate (TOPROL-XL)   Stop your apixaban (ELIQUIS) 2 days prior to surgery as instructed by cardiologist/surgeon-Last dose will be on 03-24-22 Friday  One week prior to surgery: Stop Anti-inflammatories (NSAIDS) such as Advil, Aleve, Ibuprofen, Motrin, Naproxen, Naprosyn and Aspirin based products such as Excedrin, Goodys Powder, BC Powder.You may however, take Tylenol if needed for pain up until the day of surgery.  Stop ANY OVER THE COUNTER supplements/vitamins NOW (03-23-22) until after surgery (vitamin d2)  No Alcohol for 24 hours before or after surgery.  No Smoking including e-cigarettes for 24 hours prior to surgery.  No chewable tobacco products for at least 6 hours prior to surgery.  No nicotine patches on the day of surgery.  Do not use any "recreational" drugs for at least a week prior to your surgery.  Please be advised that the combination of cocaine and anesthesia may have negative outcomes, up to and including death. If you test positive for cocaine, your surgery will be cancelled.  On the morning of surgery brush your teeth with toothpaste and water, you  may rinse your mouth with mouthwash if you wish. Do not swallow any toothpaste or mouthwash.  Do not wear jewelry, make-up, hairpins, clips or nail polish.  Do not wear lotions, powders, or perfumes.   Do not shave body from the neck down 48 hours prior to surgery just in case you cut yourself which could leave a site for infection.  Also, freshly shaved skin may become irritated if using the CHG soap.  Contact lenses, hearing aids and dentures may not be worn into surgery.  Do not bring valuables to the hospital. [facility] is not responsible for any missing/lost belongings or valuables.   Notify your doctor if there is any change in your medical condition (cold, fever, infection).  Wear comfortable clothing (specific to your surgery type) to the hospital.  After surgery, you can help prevent lung complications by doing breathing exercises.  Take deep breaths and cough every 1-2 hours. Your doctor may order a device called an Incentive Spirometer to help you take deep breaths. When coughing or sneezing, hold a pillow firmly against your incision with both hands. This is called "splinting." Doing this helps protect your incision. It also decreases belly discomfort.  If you are being admitted to the hospital overnight, leave your suitcase in the car. After surgery it may be brought to your room.  If you are being discharged the day of surgery, you will not be allowed to drive home. You will need a responsible adult (18 years or older) to drive you home and stay with you that night.   If you are taking  public transportation, you will need to have a responsible adult (18 years or older) with you. Please confirm with your physician that it is acceptable to use public transportation.   Please call the Pre-admissions Testing Dept. at [phone] if you have any questions about these instructions.  Surgery Visitation Policy:  Patients undergoing a surgery or procedure may have two  family members or support persons with them as long as the person is not COVID-19 positive or experiencing its symptoms.

## 2022-03-23 NOTE — Progress Notes (Signed)
Perioperative Services  Pre-Admission/Anesthesia Testing Clinical Review  Date: 03/23/22  Patient Demographics:  Name: Stephanie Cooper DOB:   May 08, 1956 MRN:   [id number]  Planned Surgical Procedure(s):    Case: [id number] Date/Time: 03/27/22 1660   Procedure: CYSTOSCOPY/URETEROSCOPY/HOLMIUM LASER/STENT PLACEMENT (Left)   Anesthesia type: General   Pre-op diagnosis: Left Nephrolithiasis   Location: [facility] OR ROOM 10 / [facility]S FOR ANESTHESIA [facility]   Surgeons: Vanna Scotland, MD   NOTE: Available PAT nursing documentation and vital signs have been reviewed. Clinical nursing staff has updated patient's PMH/PSHx, current medication list, and drug allergies/intolerances to ensure comprehensive history available to assist in medical decision making as it pertains to the aforementioned surgical procedure and anticipated anesthetic course. Extensive review of available clinical information performed. [place] PMH and PSHx updated with any diagnoses/procedures that  may have been inadvertently omitted during her intake with the pre-admission testing department's nursing staff.  Clinical Discussion:  Stephanie Cooper is a 66 y.o. female who is submitted for pre-surgical anesthesia review and clearance prior to her undergoing the above procedure. Patient has never been a smoker. Pertinent PMH includes: NSTEMI, paroxysmal atrial fibrillation, HOCM, CHF, HTN, GERD (on daily H2 blocker), hiatal hernia, anemia, nephrolithiasis, scoliosis.  Patient is followed by cardiology Rayann Heman, MD). She was last seen in the cardiology clinic on 11/28/2021; notes reviewed. At the time of her clinic visit, the patient denied any chest pain, shortness of breath, PND, orthopnea, palpitations, significant peripheral edema, vertiginous symptoms, or presyncope/syncope. Patient with a PMH significant for cardiovascular diagnoses.  Patient with a diagnosis of hypertrophic obstructive cardiomyopathy (HOCM).  Cardiac  function regularly monitored by cardiologist at [facility] Juliann Pares, MD) and specialist at Jettie Pagan, MD).  Patient with NYHA class I-II symptoms with severe resting LVOT gradient (100-122 mmHg).  Most recent TTE was performed on 03/28/2021 revealing a hyperdynamic left ventricular systolic function with an EF of 70-75%.  There was moderate systolic anterior motion (SAM).  Left ventricular diastolic Doppler parameters consistent with pseudonormalization (G2DD). Mild left atrial enlargement.  There was moderate mitral valve regurgitation.  Patient suffered an NSTEMI on 03/27/2021.  Troponins were trended: 36 --> 89 --> 199 --> 231 --> 212 ng/L.  Diagnostic LEFT heart catheterization was performed on 03/28/2021 revealing a hyperdynamic left ventricular systolic function with EF 75%.  LVEDP was mildly elevated.  There is moderate (3+) mitral valve regurgitation coronary anatomy noted be normal with no evidence of obstructive CAD.  Patient with an atrial fibrillation diagnosis; CHA2DS2-VASc Score = 4 (age, sex, CHF, HTN). Her rate and rhythm are currently being maintained on oral metoprolol succinate. She is chronically anticoagulated using apixaban; reported to be compliant with therapy with no evidence or reports of GI bleeding.  Blood pressure reasonably controlled at 139/66 mmHg on currently prescribed  beta-blocker (metoprolol succinate) monotherapy.  Patient is not currently taking any type of lipid-lowering therapies for ASCVD prevention.  She is not diabetic. Functional capacity, as defined by DASI, is documented as being >/= 4 METS.  Given the mildly elevated blood pressure, patient was started on daily ARB (losartan) therapy.  No other changes were made to her medication regimen.  Patient follow-up with outpatient cardiology and 6 months or sooner if needed.  Luanne Bras is scheduled for a CYSTOSCOPY/URETEROSCOPY/HOLMIUM LASER/STENT PLACEMENT (Left) on 03/27/2022 with Dr. Vanna Scotland, MD.   Given patient's past medical history significant for cardiovascular diagnoses, presurgical cardiac clearance was sought by the PAT team. Per cardiology, "this patient is optimized for  surgery and may proceed with the planned procedural course with a LOW risk of significant perioperative cardiovascular complications".  Again, this patient is on daily anticoagulation therapy.  She has been instructed on recommendations for holding her apixaban for 2 days prior to her procedure with plans to restart as soon as postoperative bleeding risk felt to be minimized by her attending surgeon. The patient has been instructed that her last dose of her apixaban will be on 03/24/2022.  Patient denies previous perioperative complications with anesthesia in the past.  Patient does make mention of a (+) familial history of PONV in a first-degree relative (daughter).  In review of the available records, it is noted that patient underwent a general anesthetic course here at [facility] (ASA III) in 11/2019 without documented complications.      03/23/2022   11:00 AM 03/08/2022    2:53 PM 03/28/2021   11:45 AM  Vitals with BMI  Height 5\' 7"  5\' 7"    Weight 200 lbs 10 oz 201 lbs   BMI 31.41 31.47   Systolic  142   Diastolic  86   Pulse  91 71    Providers/Specialists:   NOTE: Primary physician provider listed below. Patient may have been seen by APP or partner within same practice.   PROVIDER ROLE / SPECIALTY LAST , MD Urology (Surgeon) 03/08/2022  Jaquelyn Bitter, MD Primary Care Provider 03/09/2022  Lynnea Ferrier, MD Cardiology 11/08/2021  Rudean Hitt, MD Cardiology 11/28/2021   Allergies:  Lisinopril and Prednisone  Current Home Medications:   No current facility-administered medications for this encounter.    apixaban (ELIQUIS) 5 MG TABS tablet   Ergocalciferol (VITAMIN D2 PO)   famotidine (PEPCID) 20 MG tablet   losartan (COZAAR) 25 MG  tablet   metoprolol succinate (TOPROL-XL) 25 MG 24 hr tablet   montelukast (SINGULAIR) 10 MG tablet   History:   Past Medical History:  Diagnosis Date   Anemia    Chronic diastolic (congestive) heart failure (HCC) 08/18/2015   a.) TTE 08/18/2015: EF 65-70%, mild LVH, LAE, triv MR, triv TR; b.) TTE 06/24/2018: EF >55%, mod LVH, mild BAE, triv AR/PR, mod MR/TR, G1DD; c.) TTE 08/16/2020: EF >55%, mod LVH, mild TR, mod MR; d.) TTE 03/28/2021: EF 70-75%, mod SAM, LAE, mod MR, G2DD   Complication of anesthesia    Family history of adverse reaction to anesthesia    a.) PONV in 1st degree relative (daughter)   GERD (gastroesophageal reflux disease)    History of hiatal hernia    History of kidney stones    Hypertension    Hypertrophic obstructive cardiomyopathy (HCC)    a.) no pathogenic variants; b.) NYHA class I-II symptoms with severe resting gradients (100-122 mmHg).   NSTEMI (non-ST elevated myocardial infarction) (HCC) 03/27/2021   a.) troponins trended: 36 --> 89 --> 199 --> 231 --> 212; b.) LHC 03/28/2021: EF 75%, mildly elevated LVEDP, mod MR, normal coronaries/no CAD   Paroxysmal atrial fibrillation (HCC)    a.) CHA2DS2VASc = 4 (age, sex, CHF, HTN);  b.) rate/rhythm maintained on oral metoprolol succinate; chronically anticoagulated with apixaban   PONV (postoperative nausea and vomiting)    Scoliosis    Vertigo 2012   X1   Past Surgical History:  Procedure Laterality Date   COLONOSCOPY     DIAGNOSTIC LAPAROSCOPY  1990   DILATATION & CURETTAGE/HYSTEROSCOPY WITH MYOSURE N/A 12/05/2019   Procedure: FRACTIONAL DILATATION & CURETTAGE/HYSTEROSCOPY WITH MYOSURE;  Surgeon:  Schermerhorn, Ihor Austin, MD;  Location: [facility] ORS;  Service: Gynecology;  Laterality: N/A;   LEFT HEART CATH AND CORONARY ANGIOGRAPHY N/A 03/28/2021   Procedure: LEFT HEART CATH AND CORONARY ANGIOGRAPHY;  Surgeon: Lamar Blinks, MD;  Location: [facility] INVASIVE CV LAB;  Service: Cardiovascular;  Laterality: N/A;    LITHOTRIPSY     Family History  Problem Relation Age of Onset   Diverticulitis Mother    Atrial fibrillation Mother    Diabetes Father    Breast cancer Neg Hx    Social History   Tobacco Use   Smoking status: Never   Smokeless tobacco: Never  Vaping Use   Vaping Use: Never used  Substance Use Topics   Alcohol use: Yes    Comment: OCC BEER OR WINE   Drug use: Never    Pertinent Clinical Results:  LABS: Labs reviewed: Acceptable for surgery.  No visits with results within 3 Day(s) from this visit.  Latest known visit with results is:  Office Visit on 03/08/2022  Component Date Value Ref Range Status   Specific Gravity, UA 03/08/2022 1.020  1.005 - 1.030 Final   pH, UA 03/08/2022 7.0  5.0 - 7.5 Final   Color, UA 03/08/2022 Yellow  Yellow Final   Appearance Ur 03/08/2022 Hazy (A)  Clear Final   Leukocytes,UA 03/08/2022 2+ (A)  Negative Final   Protein,UA 03/08/2022 Trace (A)  Negative/Trace Final   Glucose, UA 03/08/2022 Negative  Negative Final   Ketones, UA 03/08/2022 Negative  Negative Final   RBC, UA 03/08/2022 2+ (A)  Negative Final   Bilirubin, UA 03/08/2022 Negative  Negative Final   Urobilinogen, Ur 03/08/2022 0.2  0.2 - 1.0 mg/dL Final   Nitrite, UA 62/70/3500 Negative  Negative Final   Microscopic Examination 03/08/2022 See below:   Final   Urine Culture, Comprehensive 03/08/2022 Final report   Final   Organism ID, Bacteria 03/08/2022 Comment   Final   Comment: Mixed urogenital flora 10,000-25,000 colony forming units per mL    WBC, UA 03/08/2022 >30 (A)  0 - 5 /hpf Final   RBC, Urine 03/08/2022 11-30 (A)  0 - 2 /hpf Final   Epithelial Cells (non renal) 03/08/2022 0-10  0 - 10 /hpf Final   Crystals 03/08/2022 Present (A)  N/A Final   Crystal Type 03/08/2022 Amorphous Sediment  N/A Final   Bacteria, UA 03/08/2022 Moderate (A)  None seen/Few Final    ECG: Date: 03/23/2022 Time ECG obtained: 1544 PM Rate: 62 bpm Rhythm: normal sinus Axis (leads I and  aVF): Normal Intervals: PR 150 ms. QRS 88 ms. QTc 420 ms. ST segment and T wave changes: No evidence of acute ST segment elevation or depression Comparison: Similar to previous tracing obtained on 03/27/2021   IMAGING / PROCEDURES: CT RENAL STONE STUDY performed on 02/22/2022 Atrophic LEFT kidney with cortical thinning and an upper pole cyst 2.7 x 2.2 cm. Multiple LEFT renal calculi including staghorn calculus 3.2 x 3.1 x 2.9 cm associated with mild dilatation of upper pole collecting system LEFT kidney. Tiny nonobstructing RIGHT renal calculus. Moderate-sized hiatal hernia. Small pericardial effusion. No acute intra-abdominal or intrapelvic abnormalities.  CARDIAC MRI HEART MORPHOLOGY, VELOCITY FLOW MAPPING, AND FUNCTION WITH AND WITHOUT CONTRAST performed on 07/19/2021 The left ventricle is normal in cavity size. There is moderate left ventricular hypertrophy with focal hypertrophy of the basal anteroseptum and the midventricular inferoseptum reaching 1.9 cm. Systolic function is hyperdynamic with a calculated LVEF of 80%.  There are no regional wall  motion abnormalities.  The right ventricle is normal in cavity size, wall thickness, and systolic function.   The right atrium is normal in size. The left atrium is moderately dilated.  The aortic valve is trileaflet in morphology. There is no significant aortic valve stenosis or regurgitation.  There is flow turbulence in the LVOT with associated systolic anterior motion (SAM) of the anterior mitral leaflet causing moderate mitral regurgitation. There is a gradient through the LVOT with a peak velocity of 3.4 m/s and peak gradient of 46 mmHg at rest. No maneuvers performed.  Delayed enhancement imaging is abnormal. There is faint, patchy fibrosis observed in the mid and subendocardial inferoseptal, lateral, and anterior walls. This pattern of fibrosis is a non-CAD pattern and can be seen in HCM but is otherwise nonspecific.  There is no evidence  of an intracardiac thrombus.  The pericardium is normal in thickness.  There is a small-moderate circumferential pericardial effusion.   LEFT HEART CATHETERIZATION AND CORONARY ANGIOGRAPHY performed on 03/28/2021 Hyperdynamic left ventricular systolic function with an EF of 75% LVEDP mildly elevated  Moderate mitral valve regurgitation Normal coronary arteries with 0% stenosis noted  TRANSTHORACIC ECHOCARDIOGRAM performed on 03/28/2021 Asymmetric septal hypertrophy with increased LVOT velocities and moderate SAM. Left ventricular ejection fraction, by estimation, is 70 to 75%. The left ventricle has hyperdynamic function. The left ventricle has  no regional wall motion abnormalities. There is moderate left ventricular hypertrophy. Left ventricular diastolic parameters are consistent with Grade II diastolic dysfunction  (pseudonormalization).  Right ventricular systolic function is normal. The right ventricular size is normal.  Left atrial size was mildly dilated.  The mitral valve is normal in structure. Moderate mitral valve regurgitation.  The aortic valve is normal in structure. Aortic valve regurgitation is not visualized.   Impression and Plan:  Stephanie Cooper has been referred for pre-anesthesia review and clearance prior to her undergoing the planned anesthetic and procedural courses. Available labs, pertinent testing, and imaging results were personally reviewed by me. This patient has been appropriately cleared by cardiology with an overall LOW risk of significant perioperative cardiovascular complications.  Based on clinical review performed today (03/23/22), barring any significant acute changes in the patient's overall condition, it is anticipated that she will be able to proceed with the planned surgical intervention. Any acute changes in clinical condition may necessitate her procedure being postponed and/or cancelled. Patient will meet with anesthesia team (MD and/or CRNA) on the  day of her procedure for preoperative evaluation/assessment. Questions regarding anesthetic course will be fielded at that time.   Pre-surgical instructions were reviewed with the patient during her PAT appointment and questions were fielded by PAT clinical staff. Patient was advised that if any questions or concerns arise prior to her procedure then she should return a call to PAT and/or her surgeon's office to discuss.  Quentin Mulling, MSN, APRN, FNP-C, CEN [facility]  Peri-operative Services Nurse Practitioner Phone: [phone] Fax: [phone] 03/23/22 3:50 PM  NOTE: This note has been prepared using Dragon dictation software. Despite my best ability to proofread, there is always the potential that unintentional transcriptional errors may still occur from this process.

## 2022-03-26 MED ORDER — LACTATED RINGERS IV SOLN: INTRAVENOUS | Status: DC

## 2022-03-26 MED ORDER — ORAL CARE MOUTH RINSE: 15.0000 mL | Freq: Once | OROMUCOSAL | Status: AC

## 2022-03-26 MED ORDER — CEFAZOLIN SODIUM-DEXTROSE 2-4 GM/100ML-% IV SOLN
2.0000 g | INTRAVENOUS | Status: AC
  Administered 2022-03-27: 2 g via INTRAVENOUS

## 2022-03-26 MED ORDER — CHLORHEXIDINE GLUCONATE 0.12 % MT SOLN
15.0000 mL | Freq: Once | OROMUCOSAL | Status: AC
  Administered 2022-03-27: 15 mL via OROMUCOSAL

## 2022-03-27 ENCOUNTER — Encounter: Payer: Self-pay | Admitting: Urology

## 2022-03-27 ENCOUNTER — Ambulatory Visit: Payer: BC Managed Care – PPO | Admitting: Urgent Care

## 2022-03-27 ENCOUNTER — Encounter
Admission: RE | Disposition: A | Discharge status: Home / Self Care | Payer: Self-pay | Source: Home / Self Care | Attending: Urology

## 2022-03-27 ENCOUNTER — Ambulatory Visit: Payer: BC Managed Care – PPO

## 2022-03-27 ENCOUNTER — Other Ambulatory Visit: Payer: Self-pay | Admitting: Urology

## 2022-03-27 ENCOUNTER — Ambulatory Visit
Admission: RE | Admit: 2022-03-27 | Discharge: 2022-03-27 | Disposition: A | Discharge status: Home / Self Care | Payer: BC Managed Care – PPO | Attending: Urology | Admitting: Urology

## 2022-03-27 ENCOUNTER — Other Ambulatory Visit: Payer: Self-pay

## 2022-03-27 DIAGNOSIS — R310 Gross hematuria: Secondary | ICD-10-CM | POA: Diagnosis not present

## 2022-03-27 DIAGNOSIS — Z87442 Personal history of urinary calculi: Secondary | ICD-10-CM | POA: Insufficient documentation

## 2022-03-27 DIAGNOSIS — N200 Calculus of kidney: Secondary | ICD-10-CM

## 2022-03-27 DIAGNOSIS — I252 Old myocardial infarction: Secondary | ICD-10-CM | POA: Diagnosis not present

## 2022-03-27 DIAGNOSIS — I110 Hypertensive heart disease with heart failure: Secondary | ICD-10-CM | POA: Diagnosis not present

## 2022-03-27 DIAGNOSIS — I9789 Other postprocedural complications and disorders of the circulatory system, not elsewhere classified: Secondary | ICD-10-CM | POA: Diagnosis not present

## 2022-03-27 DIAGNOSIS — I480 Paroxysmal atrial fibrillation: Secondary | ICD-10-CM | POA: Insufficient documentation

## 2022-03-27 DIAGNOSIS — I5032 Chronic diastolic (congestive) heart failure: Secondary | ICD-10-CM | POA: Insufficient documentation

## 2022-03-27 DIAGNOSIS — N132 Hydronephrosis with renal and ureteral calculous obstruction: Secondary | ICD-10-CM | POA: Diagnosis present

## 2022-03-27 DIAGNOSIS — I340 Nonrheumatic mitral (valve) insufficiency: Secondary | ICD-10-CM | POA: Diagnosis not present

## 2022-03-27 DIAGNOSIS — K219 Gastro-esophageal reflux disease without esophagitis: Secondary | ICD-10-CM | POA: Diagnosis not present

## 2022-03-27 DIAGNOSIS — D649 Anemia, unspecified: Secondary | ICD-10-CM | POA: Insufficient documentation

## 2022-03-27 DIAGNOSIS — K449 Diaphragmatic hernia without obstruction or gangrene: Secondary | ICD-10-CM | POA: Insufficient documentation

## 2022-03-27 DIAGNOSIS — I517 Cardiomegaly: Secondary | ICD-10-CM | POA: Diagnosis not present

## 2022-03-27 HISTORY — PX: CYSTOSCOPY/URETEROSCOPY/HOLMIUM LASER/STENT PLACEMENT: SHX6546

## 2022-03-27 SURGERY — CYSTOSCOPY/URETEROSCOPY/HOLMIUM LASER/STENT PLACEMENT
Anesthesia: General | Site: Ureter | Laterality: Left

## 2022-03-27 MED ORDER — DEXAMETHASONE SODIUM PHOSPHATE 10 MG/ML IJ SOLN
INTRAMUSCULAR | Status: DC | PRN
  Administered 2022-03-27: 10 mg via INTRAVENOUS

## 2022-03-27 MED ORDER — CHLORHEXIDINE GLUCONATE 0.12 % MT SOLN
OROMUCOSAL | Status: AC
  Filled 2022-03-27: qty 15

## 2022-03-27 MED ORDER — SODIUM CHLORIDE 0.9 % IR SOLN
Status: DC | PRN
  Administered 2022-03-27: 4500 mL

## 2022-03-27 MED ORDER — ACETAMINOPHEN 10 MG/ML IV SOLN
INTRAVENOUS | Status: DC | PRN
  Administered 2022-03-27: 1000 mg via INTRAVENOUS

## 2022-03-27 MED ORDER — FENTANYL CITRATE (PF) 100 MCG/2ML IJ SOLN
INTRAMUSCULAR | Status: AC
  Filled 2022-03-27: qty 2

## 2022-03-27 MED ORDER — ONDANSETRON HCL 4 MG/2ML IJ SOLN
INTRAMUSCULAR | Status: DC | PRN
  Administered 2022-03-27: 4 mg via INTRAVENOUS

## 2022-03-27 MED ORDER — CEFAZOLIN SODIUM-DEXTROSE 2-4 GM/100ML-% IV SOLN
INTRAVENOUS | Status: AC
  Filled 2022-03-27: qty 100

## 2022-03-27 MED ORDER — ACETAMINOPHEN 10 MG/ML IV SOLN
INTRAVENOUS | Status: AC
  Filled 2022-03-27: qty 100

## 2022-03-27 MED ORDER — IOHEXOL 180 MG/ML  SOLN
INTRAMUSCULAR | Status: DC | PRN
  Administered 2022-03-27: 20 mL

## 2022-03-27 MED ORDER — PROPOFOL 1000 MG/100ML IV EMUL
INTRAVENOUS | Status: AC
  Filled 2022-03-27: qty 100

## 2022-03-27 MED ORDER — OXYBUTYNIN CHLORIDE 5 MG PO TABS: 5.0000 mg | ORAL_TABLET | Freq: Three times a day (TID) | ORAL | 0 refills | Status: DC | PRN

## 2022-03-27 MED ORDER — TAMSULOSIN HCL 0.4 MG PO CAPS: 0.4000 mg | ORAL_CAPSULE | Freq: Every day | ORAL | 0 refills | Status: DC

## 2022-03-27 MED ORDER — OXYCODONE HCL 5 MG PO TABS: 5.0000 mg | ORAL_TABLET | Freq: Once | ORAL | Status: DC | PRN

## 2022-03-27 MED ORDER — SUGAMMADEX SODIUM 200 MG/2ML IV SOLN
INTRAVENOUS | Status: DC | PRN
  Administered 2022-03-27: 200 mg via INTRAVENOUS

## 2022-03-27 MED ORDER — OXYCODONE HCL 5 MG/5ML PO SOLN: 5.0000 mg | Freq: Once | ORAL | Status: DC | PRN

## 2022-03-27 MED ORDER — DEXMEDETOMIDINE HCL IN NACL 80 MCG/20ML IV SOLN
INTRAVENOUS | Status: DC | PRN
  Administered 2022-03-27 (×2): 8 ug via BUCCAL

## 2022-03-27 MED ORDER — LIDOCAINE HCL (CARDIAC) PF 100 MG/5ML IV SOSY
PREFILLED_SYRINGE | INTRAVENOUS | Status: DC | PRN
  Administered 2022-03-27: 80 mg via INTRAVENOUS

## 2022-03-27 MED ORDER — HYDROCODONE-ACETAMINOPHEN 5-325 MG PO TABS: 1.0000 | ORAL_TABLET | Freq: Four times a day (QID) | ORAL | 0 refills | Status: DC | PRN

## 2022-03-27 MED ORDER — PROPOFOL 10 MG/ML IV BOLUS
INTRAVENOUS | Status: DC | PRN
  Administered 2022-03-27: 50 mg via INTRAVENOUS
  Administered 2022-03-27: 30 mg via INTRAVENOUS
  Administered 2022-03-27: 120 mg via INTRAVENOUS

## 2022-03-27 MED ORDER — FENTANYL CITRATE (PF) 100 MCG/2ML IJ SOLN
INTRAMUSCULAR | Status: DC | PRN
  Administered 2022-03-27 (×2): 50 ug via INTRAVENOUS

## 2022-03-27 MED ORDER — PROPOFOL 500 MG/50ML IV EMUL
INTRAVENOUS | Status: DC | PRN
  Administered 2022-03-27: 130 ug/kg/min via INTRAVENOUS

## 2022-03-27 MED ORDER — ROCURONIUM BROMIDE 100 MG/10ML IV SOLN
INTRAVENOUS | Status: DC | PRN
  Administered 2022-03-27: 50 mg via INTRAVENOUS
  Administered 2022-03-27 (×2): 20 mg via INTRAVENOUS

## 2022-03-27 MED ORDER — HYDROMORPHONE HCL 1 MG/ML IJ SOLN: 0.2500 mg | INTRAMUSCULAR | Status: DC | PRN

## 2022-03-27 SURGICAL SUPPLY — 32 items
ADH LQ OCL WTPRF AMP STRL LF (MISCELLANEOUS)
ADHESIVE MASTISOL STRL (MISCELLANEOUS) IMPLANT
BAG DRAIN SIEMENS DORNER NS (MISCELLANEOUS) ×1 IMPLANT
BAG DRN NS LF (MISCELLANEOUS) ×1
BAG PRESSURE INF REUSE 3000 (BAG) IMPLANT
BASKET ZERO TIP 1.9FR (BASKET) IMPLANT
BRUSH SCRUB EZ 1% IODOPHOR (MISCELLANEOUS) ×1 IMPLANT
BSKT STON RTRVL ZERO TP 1.9FR (BASKET)
CATH URET FLEX-TIP 2 LUMEN 10F (CATHETERS) IMPLANT
CATH URETL OPEN 5X70 (CATHETERS) ×1 IMPLANT
CNTNR SPEC 2.5X3XGRAD LEK (MISCELLANEOUS)
CONT SPEC 4OZ STER OR WHT (MISCELLANEOUS)
CONT SPEC 4OZ STRL OR WHT (MISCELLANEOUS)
CONTAINER SPEC 2.5X3XGRAD LEK (MISCELLANEOUS) IMPLANT
DRAPE UTILITY 15X26 TOWEL STRL (DRAPES) ×1 IMPLANT
DRSG TEGADERM 2-3/8X2-3/4 SM (GAUZE/BANDAGES/DRESSINGS) IMPLANT
FIBER LASER MOSES 200 DFL (Laser) IMPLANT
GLOVE BIO SURGEON STRL SZ 6.5 (GLOVE) ×1 IMPLANT
GOWN STRL REUS W/ TWL LRG LVL3 (GOWN DISPOSABLE) ×2 IMPLANT
GOWN STRL REUS W/TWL LRG LVL3 (GOWN DISPOSABLE) ×2
GUIDEWIRE GREEN .038 145CM (MISCELLANEOUS) IMPLANT
GUIDEWIRE STR DUAL SENSOR (WIRE) ×1 IMPLANT
IV NS IRRIG 3000ML ARTHROMATIC (IV SOLUTION) ×1 IMPLANT
KIT TURNOVER CYSTO (KITS) ×1 IMPLANT
PACK CYSTO AR (MISCELLANEOUS) ×1 IMPLANT
SET CYSTO W/LG BORE CLAMP LF (SET/KITS/TRAYS/PACK) ×1 IMPLANT
SHEATH NAVIGATOR HD 12/14X36 (SHEATH) IMPLANT
STENT URET 6FRX24 CONTOUR (STENTS) IMPLANT
STENT URET 6FRX26 CONTOUR (STENTS) IMPLANT
SURGILUBE 2OZ TUBE FLIPTOP (MISCELLANEOUS) ×1 IMPLANT
TRAP FLUID SMOKE EVACUATOR (MISCELLANEOUS) ×1 IMPLANT
WATER STERILE IRR 500ML POUR (IV SOLUTION) ×1 IMPLANT

## 2022-03-27 NOTE — Interval H&P Note (Signed)
History and Physical Interval Note:  03/27/2022 8:58 AM  Stephanie Cooper  has presented today for surgery, with the diagnosis of Left Nephrolithiasis.  The various methods of treatment have been discussed with the patient and family. After consideration of risks, benefits and other options for treatment, the patient has consented to  Procedure(s): CYSTOSCOPY/URETEROSCOPY/HOLMIUM LASER/STENT PLACEMENT (Left) as a surgical intervention.  The patient's history has been reviewed, patient examined, no change in status, stable for surgery.  I have reviewed the patient's chart and labs.  Questions were answered to the patient's satisfaction.    RRR CTAB   Vanna Scotland

## 2022-03-27 NOTE — Progress Notes (Signed)
Surgical Physician Order Form [facility] [place]  * Scheduling expectation :  3-4 weeks post op  *Length of Case:   *Clearance needed: no  *Anticoagulation Instructions: Hold all anticoagulants  *Aspirin Instructions: Ok to continue Aspirin  *Post-op visit Date/Instructions:  1 week cysto stent removal  *Diagnosis: Left Nephrolithiasis  *Procedure: left  Ureteroscopy w/laser lithotripsy & stent exchange (21194)   Additional orders: N/A  -Admit type: OUTpatient  -Anesthesia: General  -VTE Prophylaxis Standing Order SCD's       Other:   -Standing Lab Orders Per Anesthesia    Lab other: UA&Urine Culture  -Standing Test orders EKG/Chest x-ray per Anesthesia       Test other:   - Medications:  Ancef 2gm IV  -Other orders:  N/A

## 2022-03-27 NOTE — Anesthesia Postprocedure Evaluation (Signed)
Anesthesia Post Note  Patient: DEANNDRA KIRLEY  Procedure(s) Performed: CYSTOSCOPY/URETEROSCOPY/HOLMIUM LASER/STENT PLACEMENT (Left: Ureter)  Patient location during evaluation: PACU Anesthesia Type: General Level of consciousness: awake and alert Pain management: pain level controlled Vital Signs Assessment: post-procedure vital signs reviewed and stable Respiratory status: spontaneous breathing, nonlabored ventilation, respiratory function stable and patient connected to nasal cannula oxygen Cardiovascular status: blood pressure returned to baseline and stable Postop Assessment: no apparent nausea or vomiting Anesthetic complications: no   No notable events documented.   Last Vitals:  Vitals:   03/27/22 1135 03/27/22 1139  BP:  (!) 151/68  Pulse: (!) 49 (!) 6  Resp:  15  Temp:  36.7 C  SpO2: 96% 94%    Last Pain:  Vitals:   03/27/22 1139  TempSrc: Temporal  PainSc: 0-No pain                 Louie Boston

## 2022-03-27 NOTE — Anesthesia Preprocedure Evaluation (Signed)
Anesthesia Evaluation  Patient identified by MRN, date of birth, ID band Patient awake    Reviewed: Allergy & Precautions, NPO status , Patient's Chart, lab work & pertinent test results  History of Anesthesia Complications (+) PONV and history of anesthetic complications  Airway Mallampati: II  TM Distance: >3 FB Neck ROM: full    Dental  (+) Teeth Intact   Pulmonary neg pulmonary ROS   Pulmonary exam normal        Cardiovascular Exercise Tolerance: Good hypertension, Pt. on medications and On Home Beta Blockers (-) angina + Past MI and +CHF  (-) DOE negative cardio ROS + dysrhythmias Atrial Fibrillation + Valvular Problems/Murmurs (HOCM)  Rhythm:Regular Rate:Bradycardia + Systolic murmurs IMPRESSIONS     1. Aympetric septal hypertrophy with increased LVOT velocities and  moderate SAM. Left ventricular ejection fraction, by estimation, is 70 to  75%. The left ventricle has hyperdynamic function. The left ventricle has  no regional wall motion abnormalities.   There is moderate left ventricular hypertrophy. Left ventricular  diastolic parameters are consistent with Grade II diastolic dysfunction  (pseudonormalization).   2. Right ventricular systolic function is normal. The right ventricular  size is normal.   3. Left atrial size was mildly dilated.   4. The mitral valve is normal in structure. Moderate mitral valve  regurgitation.   5. The aortic valve is normal in structure. Aortic valve regurgitation is  not visualized.      Neuro/Psych negative neurological ROS  negative psych ROS   GI/Hepatic negative GI ROS, Neg liver ROS, hiatal hernia,GERD  ,,  Endo/Other  negative endocrine ROS    Renal/GU      Musculoskeletal   Abdominal   Peds  Hematology negative hematology ROS (+)   Anesthesia Other Findings Past Medical History: No date: Anemia [id number]: Chronic diastolic (congestive) heart failure  (HCC)     Comment:  a.) TTE 08/18/2015: EF 65-70%, mild LVH, LAE, triv MR,               triv TR; b.) TTE 06/24/2018: EF >55%, mod LVH, mild BAE,               triv AR/PR, mod MR/TR, G1DD; c.) TTE 08/16/2020: EF >55%,              mod LVH, mild TR, mod MR; d.) TTE 03/28/2021: EF 70-75%,               mod SAM, LAE, mod MR, [id number] No date: Complication of anesthesia No date: Family history of adverse reaction to anesthesia     Comment:  a.) PONV in 1st degree relative (daughter) No date: GERD (gastroesophageal reflux disease) No date: History of hiatal hernia No date: History of kidney stones No date: Hypertension No date: Hypertrophic obstructive cardiomyopathy ([place])     Comment:  a.) no pathogenic variants; b.) NYHA class I-II symptoms              with severe resting gradients (100-122 mmHg). 03/27/2021: NSTEMI (non-ST elevated myocardial infarction) ([place])     Comment:  a.) troponins trended: 36 --> 89 --> 199 --> 231 -->               212; b.) LHC 03/28/2021: EF 75%, mildly elevated LVEDP,               mod MR, normal coronaries/no CAD No date: Paroxysmal atrial fibrillation (HCC)     Comment:  a.) CHA2DS2VASc = 4 (age, sex, CHF,  HTN);  b.)               rate/rhythm maintained on oral metoprolol succinate;               chronically anticoagulated with apixaban No date: PONV (postoperative nausea and vomiting) No date: Scoliosis 2012: Vertigo     Comment:  X1  Past Surgical History: No date: COLONOSCOPY 1990: DIAGNOSTIC LAPAROSCOPY 12/05/2019: DILATATION & CURETTAGE/HYSTEROSCOPY WITH MYOSURE; N/A     Comment:  Procedure: FRACTIONAL DILATATION &               CURETTAGE/HYSTEROSCOPY WITH MYOSURE;  Surgeon:               Schermerhorn, Ihor Austin, MD;  Location: [facility] ORS;                Service: Gynecology;  Laterality: N/A; 03/28/2021: LEFT HEART CATH AND CORONARY ANGIOGRAPHY; N/A     Comment:  Procedure: LEFT HEART CATH AND CORONARY ANGIOGRAPHY;                Surgeon: Lamar Blinks, MD;  Location: [facility] INVASIVE               CV LAB;  Service: Cardiovascular;  Laterality: N/A; No date: LITHOTRIPSY     Reproductive/Obstetrics negative OB ROS                             Anesthesia Physical Anesthesia Plan  ASA: 4  Anesthesia Plan: General LMA   Post-op Pain Management: Toradol IV (intra-op)* and Ofirmev IV (intra-op)*   Induction: Intravenous  PONV Risk Score and Plan: Dexamethasone, Ondansetron, Midazolam and Treatment may vary due to age or medical condition  Airway Management Planned: Oral ETT  Additional Equipment:   Intra-op Plan:   Post-operative Plan: Extubation in OR  Informed Consent: I have reviewed the patients History and Physical, chart, labs and discussed the procedure including the risks, benefits and alternatives for the proposed anesthesia with the patient or authorized representative who has indicated his/her understanding and acceptance.     Dental Advisory Given  Plan Discussed with: Anesthesiologist, CRNA and Surgeon  Anesthesia Plan Comments: (Patient consented for risks of anesthesia including but not limited to:  - adverse reactions to medications - damage to eyes, teeth, lips or other oral mucosa - nerve damage due to positioning  - sore throat or hoarseness - Damage to heart, brain, nerves, lungs, other parts of body or loss of life  Patient voiced understanding.)        Anesthesia Quick Evaluation

## 2022-03-27 NOTE — Anesthesia Procedure Notes (Signed)
Procedure Name: Intubation Date/Time: 03/27/2022 9:23 AM  Performed by: Jaye Beagle, CRNAPre-anesthesia Checklist: Patient identified, Emergency Drugs available, Suction available and Patient being monitored Patient Re-evaluated:Patient Re-evaluated prior to induction Oxygen Delivery Method: Circle system utilized Preoxygenation: Pre-oxygenation with 100% oxygen Induction Type: IV induction Ventilation: Mask ventilation without difficulty Laryngoscope Size: McGraph Grade View: Grade I Tube type: Oral Number of attempts: 1 Airway Equipment and Method: Stylet and Oral airway Placement Confirmation: ETT inserted through vocal cords under direct vision, positive ETCO2 and breath sounds checked- equal and bilateral Secured at: 21 cm Tube secured with: Tape Dental Injury: Teeth and Oropharynx as per pre-operative assessment

## 2022-03-27 NOTE — Discharge Instructions (Addendum)
You have a ureteral stent in place.  This is a tube that extends from your kidney to your bladder.  This may cause urinary bleeding, burning with urination, and urinary frequency.  Please call our office or present to the ED if you develop fevers >101 or pain which is not able to be controlled with oral pain medications.  You may be given either Flomax and/ or ditropan to help with bladder spasms and stent pain in addition to pain medications.    [facility] [address], [address] [place], [place] [address] [phone]  AMBULATORY SURGERY  DISCHARGE INSTRUCTIONS   The drugs that you were given will stay in your system until tomorrow so for the next 24 hours you should not:  Drive an automobile Make any legal decisions Drink any alcoholic beverage   You may resume regular meals tomorrow.  Today it is better to start with liquids and gradually work up to solid foods.  You may eat anything you prefer, but it is better to start with liquids, then soup and crackers, and gradually work up to solid foods.   Please notify your doctor immediately if you have any unusual bleeding, trouble breathing, redness and pain at the surgery site, drainage, fever, or pain not relieved by medication.    Additional Instructions:   Please contact your physician with any problems or Same Day Surgery at [phone], Monday through Friday 6 am to 4 pm, or [place] [facility] [place] Main number at [phone].  

## 2022-03-27 NOTE — Transfer of Care (Signed)
Immediate Anesthesia Transfer of Care Note  Patient: Stephanie Cooper  Procedure(s) Performed: CYSTOSCOPY/URETEROSCOPY/HOLMIUM LASER/STENT PLACEMENT (Left: Ureter)  Patient Location: PACU  Anesthesia Type:General  Level of Consciousness: drowsy  Airway & Oxygen Therapy: Patient Spontanous Breathing and Patient connected to face mask oxygen  Post-op Assessment: Report given to RN  Post vital signs: stable  Last Vitals:  Vitals Value Taken Time  BP    Temp    Pulse    Resp    SpO2      Last Pain:  Vitals:   03/27/22 0843  TempSrc: Oral  PainSc: 0-No pain      Patients Stated Pain Goal: 0 (03/27/22 0843)  Complications: No notable events documented.

## 2022-03-27 NOTE — Op Note (Signed)
Date of procedure: 03/27/22  Preoperative diagnosis:  Left staghorn calculus, up to 3.2 cm  Postoperative diagnosis:  Same as above  Procedure: Left ureteroscopy with laser lithotripsy Left retrograde pyelogram Left ureteral stent placement Interpretation of fluoroscopy less than 30 minutes  Surgeon: Vanna Scotland, MD  Anesthesia: General  Complications: None  Intraoperative findings: Large partial staghorn calculus with multiple calyceal and renal pelvic stones, mildly stenotic infundibula.  Majority of significant stone burden fragmented, significant stone dust and debris at the end of the procedure.  Stent placed with plan for return to procedure in 3 to 4 weeks to treat all residual remaining stone.  EBL: Minimal  Specimens: None  Drains: 6 x 24 French double-J ureteral stent on left  Indication: Stephanie Cooper is a 66 y.o. 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  After reviewing the management options for treatment, he elected to proceed with the above surgical procedure(s). We have discussed the potential benefits and risks of the procedure, side effects of the proposed treatment, the likelihood of the patient achieving the goals of the procedure, and any potential problems that might occur during the procedure or recuperation. Informed consent has been obtained.  Description of procedure:  The patient was taken to the operating room and general anesthesia was induced.  The patient was placed in the dorsal lithotomy position, prepped and draped in the usual sterile fashion, and preoperative antibiotics were administered. A preoperative time-out was performed.   I attempted to place a 15 French cystoscope however she had narrow urethral meatus.  I used the female dilators to dilate up to 30 [place].  I then advanced a 28 [place] scope into the bladder.  The bladder was inspected.   Notably, she did have significant prolapse of the trigone.  I located the left UO and cannulated using a open-ended ureteral catheter.  At this point time, a retrograde pyelogram was performed, prior to which the large staghorn calculus to be seen on scout.  Retrograde was unremarkable in terms of the ureter which was decompressed without filling defects or concern for ureteral calculi.  There is significant filling defects in the renal pelvis consistent with known stone.  Sensor wire is in place up to the level of the kidney.  A dual-lumen ureteral access sheath was used to introduce a second Super Stiff wire which was used as a working wire.  The sensor wire was excluded and snapped in place as a safety.  A 12/14 French 35 cm access sheath was then advanced over the Super Stiff wire up to the level of the proximal ureter fluoroscopically.  This catheter advanced easily.  The inner lumen was removed.  A dual flexible 8 [place] digital ureteroscope was then brought in and advanced level of the stone.  A 273 m laser fiber was then used with settings of 0.3 J and 80 Hz in a dusting setting to begin dusting the stone.  Started with renal pelvis and upper tract stones.  This created significant debris.  I then navigated into the lower pole began to address this stone burden.  He had some difficulty in the mid upper pole as the infundibula here were relatively stenotic and obscured by poor visualization from all the other stone debris.  Ultimately, I was able to treat and fragment all of these.  I ended up lasering at least an hour and a half constantly creating large amount  of stone dust and debris.  On fluoroscopy, there was still radiopaque material however was was much more amorphous and likely consistent with all of the stone dust and debris rather than a large amount of solid stone.  At this point time, I elected to return at a later date for the purpose of a staged procedure once some of the stone dust and debris  have cleared for better visualization to ensure that there is no significant residual stone burden.  A final retrograde pyelogram create a roadmap to ensure that each every calyx was directly visualized and no significant large stones remained.  There is no contrast extravasation noted.  I backed the scope down the length the ureter removing the ureteral access sheath along the way.  No ureteral injuries were appreciated.  A 6 x 24 French double-J ureteral stent was advanced over the wire up to the level of the kidney.  Upon wire withdrawal, there is a full coil noted both within the renal pelvis as well as within the bladder.  Bladder was then drained.  The patient was then cleaned and dried, repositioned in supine position, reversed of anesthesia, and taken the PACU in stable condition.  Plan: We will have her return in 3 to 4 weeks for staged ureteroscopy laser lithotripsy and stent exchange.  She will need a repeat urine culture a week or 2 prior to her next procedure.  Hollice Espy, M.D.

## 2022-03-28 ENCOUNTER — Encounter: Payer: Self-pay | Admitting: Urology

## 2022-03-31 ENCOUNTER — Encounter: Payer: Self-pay | Admitting: Urology

## 2022-03-31 ENCOUNTER — Telehealth: Payer: Self-pay

## 2022-03-31 NOTE — Telephone Encounter (Signed)
Drink 4 to 6 glasses of water a day to dilute your urine and help flush out your urinary tract. You will see blood in your urine for several days. This is normal. You may feel pain in your bladder and burning when you urinate.   Ok to advise patient?

## 2022-03-31 NOTE — Telephone Encounter (Signed)
I spoke with Mrs. Stephanie Cooper. We have discussed possible surgery dates and Monday December 11th, 2023 was agreed upon by all parties. Patient given information about surgery date, what to expect pre-operatively and post operatively.  We discussed that a Pre-Admission Testing office will be calling to set up the pre-op visit that will take place prior to surgery, and that these appointments are typically done over the phone with a Pre-Admissions RN.  Informed patient that our office will communicate any additional care to be provided after surgery. Patients questions or concerns were discussed during our call. Advised to call our office should there be any additional information, questions or concerns that arise. Patient verbalized understanding.

## 2022-03-31 NOTE — Progress Notes (Signed)
[facility] Posting Form   Surgery Date/Time: Date: 04/24/2022  Surgeon: Dr. Vanna Scotland, MD  Surgery Location: Day Surgery  Inpt ( No  )   Outpt (Yes)   Obs ( No  )   Diagnosis: N20.0 Left Nephrolithiasis  -CPT: 26378  Surgery: Left Ureteroscopy with laser lithotripsy and stent exchange  Stop Anticoagulations: Harlon Flor but may continue ASA  Cardiac/Medical/Pulmonary Clearance needed: no,obtained in 03/2022  *Orders entered into EPIC  Date: 03/31/22   *Case booked in EPIC  Date: 03/28/2022  *Notified pt of Surgery: Date: 03/28/2022  PRE-OP UA & CX: yes, will obtain in clinic on 04/12/2022  *Placed into Prior Authorization Work Angela Nevin Date: 03/31/22  Assistant/laser/rep:No

## 2022-04-12 ENCOUNTER — Other Ambulatory Visit: Payer: BC Managed Care – PPO

## 2022-04-12 DIAGNOSIS — N200 Calculus of kidney: Secondary | ICD-10-CM

## 2022-04-13 LAB — URINALYSIS, COMPLETE
Bilirubin, UA: NEGATIVE
Nitrite, UA: NEGATIVE
Specific Gravity, UA: 1.02 (ref 1.005–1.030)
Urobilinogen, Ur: 1 mg/dL (ref 0.2–1.0)
pH, UA: 7 (ref 5.0–7.5)

## 2022-04-13 LAB — MICROSCOPIC EXAMINATION
Bacteria, UA: NONE SEEN
RBC, Urine: >30 /hpf — AB (ref 0–2)

## 2022-04-15 LAB — CULTURE, URINE COMPREHENSIVE

## 2022-04-17 ENCOUNTER — Encounter
Admission: RE | Admit: 2022-04-17 | Discharge: 2022-04-17 | Disposition: A | Discharge status: Home / Self Care | Payer: BC Managed Care – PPO | Source: Ambulatory Visit | Attending: Urology | Admitting: Urology

## 2022-04-17 ENCOUNTER — Other Ambulatory Visit: Payer: Self-pay

## 2022-04-17 NOTE — Patient Instructions (Signed)
Your procedure is scheduled on:04/24/22 Report to the Registration Desk on the 1st floor of the [facility].Then proceed to the 2nd floor Surgery Desk To find out your arrival time, please call [phone] between 1PM - 3PM on:04/21/22 If your arrival time is 6:00 am, do not arrive prior to that time as the [facility] entrance doors do not open until 6:00 am.   REMEMBER: Instructions that are not followed completely may result in serious medical risk, up to and including death; or upon the discretion of your surgeon and anesthesiologist your surgery may need to be rescheduled.   Do not eat food OR drink any liquids after midnight the night before surgery.  No gum chewing, lozengers or hard candies.   TAKE THESE MEDICATIONS THE MORNING OF SURGERY WITH A SIP OF WATER: -famotidine (PEPCID)  -metoprolol succinate (TOPROL-XL)    Stop your apixaban (ELIQUIS) 2 days prior to surgery as instructed by cardiologist/surgeon-Last dose will be on 03-24-22 Friday   One week prior to surgery: Stop Anti-inflammatories (NSAIDS) such as Advil, Aleve, Ibuprofen, Motrin, Naproxen, Naprosyn and Aspirin based products such as Excedrin, Goodys Powder, BC Powder.You may however, take Tylenol if needed for pain up until the day of surgery.   Stop ANY OVER THE COUNTER supplements/vitamins NOW (04/17/22) until after surgery (vitamin d2)   No Alcohol for 24 hours before or after surgery.   No Smoking including e-cigarettes for 24 hours prior to surgery.  No chewable tobacco products for at least 6 hours prior to surgery.  No nicotine patches on the day of surgery.   Do not use any "recreational" drugs for at least a week prior to your surgery.  Please be advised that the combination of cocaine and anesthesia may have negative outcomes, up to and including death. If you test positive for cocaine, your surgery will be cancelled.   On the morning of surgery brush your teeth with toothpaste and water, you may  rinse your mouth with mouthwash if you wish. Do not swallow any toothpaste or mouthwash.   Do not wear jewelry, make-up, hairpins, clips or nail polish.   Do not wear lotions, powders, or perfumes.    Do not shave body from the neck down 48 hours prior to surgery just in case you cut yourself which could leave a site for infection.  Also, freshly shaved skin may become irritated if using the CHG soap.   Contact lenses, hearing aids and dentures may not be worn into surgery.   Do not bring valuables to the hospital. [facility] is not responsible for any missing/lost belongings or valuables.    Notify your doctor if there is any change in your medical condition (cold, fever, infection).   Wear comfortable clothing (specific to your surgery type) to the hospital.   After surgery, you can help prevent lung complications by doing breathing exercises.  Take deep breaths and cough every 1-2 hours. Your doctor may order a device called an Incentive Spirometer to help you take deep breaths. When coughing or sneezing, hold a pillow firmly against your incision with both hands. This is called "splinting." Doing this helps protect your incision. It also decreases belly discomfort.   If you are being admitted to the hospital overnight, leave your suitcase in the car. After surgery it may be brought to your room.   If you are being discharged the day of surgery, you will not be allowed to drive home. You will need a responsible adult (18 years  or older) to drive you home and stay with you that night.    If you are taking public transportation, you will need to have a responsible adult (18 years or older) with you. Please confirm with your physician that it is acceptable to use public transportation.    Please call the [facility] at [phone] if you have any questions about these instructions.   Surgery Visitation Policy:   Patients undergoing a surgery or procedure may  have two family members or support persons with them as long as the person is not COVID-19 positive or experiencing its symptoms

## 2022-04-23 MED ORDER — CEFAZOLIN SODIUM-DEXTROSE 2-4 GM/100ML-% IV SOLN
2.0000 g | INTRAVENOUS | Status: AC
  Administered 2022-04-24: 2 g via INTRAVENOUS

## 2022-04-23 MED ORDER — LACTATED RINGERS IV SOLN: INTRAVENOUS | Status: DC

## 2022-04-23 MED ORDER — ORAL CARE MOUTH RINSE: 15.0000 mL | Freq: Once | OROMUCOSAL | Status: AC

## 2022-04-23 MED ORDER — CHLORHEXIDINE GLUCONATE 0.12 % MT SOLN: 15.0000 mL | Freq: Once | OROMUCOSAL | Status: AC

## 2022-04-24 ENCOUNTER — Other Ambulatory Visit: Payer: Self-pay

## 2022-04-24 ENCOUNTER — Ambulatory Visit: Payer: BC Managed Care – PPO

## 2022-04-24 ENCOUNTER — Ambulatory Visit: Payer: BC Managed Care – PPO | Admitting: Anesthesiology

## 2022-04-24 ENCOUNTER — Encounter
Admission: RE | Disposition: A | Discharge status: Home / Self Care | Payer: Self-pay | Source: Home / Self Care | Attending: Urology

## 2022-04-24 ENCOUNTER — Ambulatory Visit
Admission: RE | Admit: 2022-04-24 | Discharge: 2022-04-24 | Disposition: A | Discharge status: Home / Self Care | Payer: BC Managed Care – PPO | Attending: Urology | Admitting: Urology

## 2022-04-24 ENCOUNTER — Encounter: Payer: Self-pay | Admitting: Urology

## 2022-04-24 DIAGNOSIS — N200 Calculus of kidney: Secondary | ICD-10-CM | POA: Insufficient documentation

## 2022-04-24 DIAGNOSIS — K449 Diaphragmatic hernia without obstruction or gangrene: Secondary | ICD-10-CM | POA: Insufficient documentation

## 2022-04-24 DIAGNOSIS — K219 Gastro-esophageal reflux disease without esophagitis: Secondary | ICD-10-CM | POA: Diagnosis not present

## 2022-04-24 DIAGNOSIS — I5032 Chronic diastolic (congestive) heart failure: Secondary | ICD-10-CM | POA: Diagnosis not present

## 2022-04-24 DIAGNOSIS — I252 Old myocardial infarction: Secondary | ICD-10-CM | POA: Insufficient documentation

## 2022-04-24 DIAGNOSIS — I480 Paroxysmal atrial fibrillation: Secondary | ICD-10-CM | POA: Diagnosis not present

## 2022-04-24 DIAGNOSIS — I421 Obstructive hypertrophic cardiomyopathy: Secondary | ICD-10-CM | POA: Diagnosis not present

## 2022-04-24 DIAGNOSIS — Z7901 Long term (current) use of anticoagulants: Secondary | ICD-10-CM | POA: Diagnosis not present

## 2022-04-24 DIAGNOSIS — R310 Gross hematuria: Secondary | ICD-10-CM | POA: Diagnosis not present

## 2022-04-24 DIAGNOSIS — I110 Hypertensive heart disease with heart failure: Secondary | ICD-10-CM | POA: Diagnosis not present

## 2022-04-24 DIAGNOSIS — Z79899 Other long term (current) drug therapy: Secondary | ICD-10-CM | POA: Diagnosis not present

## 2022-04-24 HISTORY — PX: CYSTOSCOPY/URETEROSCOPY/HOLMIUM LASER/STENT PLACEMENT: SHX6546

## 2022-04-24 SURGERY — CYSTOSCOPY/URETEROSCOPY/HOLMIUM LASER/STENT PLACEMENT
Anesthesia: General | Laterality: Left

## 2022-04-24 MED ORDER — PROPOFOL 1000 MG/100ML IV EMUL
INTRAVENOUS | Status: AC
  Filled 2022-04-24: qty 100

## 2022-04-24 MED ORDER — LACTATED RINGERS IV SOLN: INTRAVENOUS | Status: DC

## 2022-04-24 MED ORDER — OXYCODONE HCL 5 MG/5ML PO SOLN: 5.0000 mg | Freq: Once | ORAL | Status: DC | PRN

## 2022-04-24 MED ORDER — ACETAMINOPHEN 10 MG/ML IV SOLN
INTRAVENOUS | Status: DC | PRN
  Administered 2022-04-24: 1000 mg via INTRAVENOUS

## 2022-04-24 MED ORDER — MIDAZOLAM HCL 2 MG/2ML IJ SOLN
INTRAMUSCULAR | Status: AC
  Filled 2022-04-24: qty 2

## 2022-04-24 MED ORDER — ONDANSETRON HCL 4 MG/2ML IJ SOLN
INTRAMUSCULAR | Status: AC
  Filled 2022-04-24: qty 2

## 2022-04-24 MED ORDER — CHLORHEXIDINE GLUCONATE 0.12 % MT SOLN
OROMUCOSAL | Status: AC
  Administered 2022-04-24: 15 mL via OROMUCOSAL
  Filled 2022-04-24: qty 15

## 2022-04-24 MED ORDER — FENTANYL CITRATE (PF) 100 MCG/2ML IJ SOLN
INTRAMUSCULAR | Status: DC | PRN
  Administered 2022-04-24 (×2): 50 ug via INTRAVENOUS

## 2022-04-24 MED ORDER — IOHEXOL 180 MG/ML  SOLN
INTRAMUSCULAR | Status: DC | PRN
  Administered 2022-04-24: 10 mL

## 2022-04-24 MED ORDER — SUGAMMADEX SODIUM 200 MG/2ML IV SOLN
INTRAVENOUS | Status: DC | PRN
  Administered 2022-04-24: 200 mg via INTRAVENOUS

## 2022-04-24 MED ORDER — IOHEXOL 180 MG/ML  SOLN
INTRAMUSCULAR | Status: DC | PRN
  Administered 2022-04-24: 20 mL

## 2022-04-24 MED ORDER — DEXAMETHASONE SODIUM PHOSPHATE 10 MG/ML IJ SOLN
INTRAMUSCULAR | Status: DC | PRN
  Administered 2022-04-24: 10 mg via INTRAVENOUS

## 2022-04-24 MED ORDER — OXYCODONE HCL 5 MG PO TABS: 5.0000 mg | ORAL_TABLET | Freq: Once | ORAL | Status: DC | PRN

## 2022-04-24 MED ORDER — HYDROCODONE-ACETAMINOPHEN 5-325 MG PO TABS: 1.0000 | ORAL_TABLET | Freq: Four times a day (QID) | ORAL | 0 refills | Status: DC | PRN

## 2022-04-24 MED ORDER — ONDANSETRON HCL 4 MG/2ML IJ SOLN
INTRAMUSCULAR | Status: DC | PRN
  Administered 2022-04-24: 4 mg via INTRAVENOUS

## 2022-04-24 MED ORDER — ROCURONIUM BROMIDE 100 MG/10ML IV SOLN
INTRAVENOUS | Status: DC | PRN
  Administered 2022-04-24: 60 mg via INTRAVENOUS

## 2022-04-24 MED ORDER — TAMSULOSIN HCL 0.4 MG PO CAPS: 0.4000 mg | ORAL_CAPSULE | Freq: Every day | ORAL | 0 refills | Status: DC

## 2022-04-24 MED ORDER — SODIUM CHLORIDE 0.9 % IR SOLN
Status: DC | PRN
  Administered 2022-04-24: 3000 mL

## 2022-04-24 MED ORDER — PROPOFOL 10 MG/ML IV BOLUS
INTRAVENOUS | Status: AC
  Filled 2022-04-24: qty 20

## 2022-04-24 MED ORDER — OXYBUTYNIN CHLORIDE 5 MG PO TABS: 5.0000 mg | ORAL_TABLET | Freq: Three times a day (TID) | ORAL | 0 refills | Status: DC | PRN

## 2022-04-24 MED ORDER — PROPOFOL 10 MG/ML IV BOLUS
INTRAVENOUS | Status: DC | PRN
  Administered 2022-04-24: 120 mg via INTRAVENOUS
  Administered 2022-04-24: 140 ug/kg/min via INTRAVENOUS

## 2022-04-24 MED ORDER — ACETAMINOPHEN 10 MG/ML IV SOLN: 1000.0000 mg | Freq: Once | INTRAVENOUS | Status: DC | PRN

## 2022-04-24 MED ORDER — MIDAZOLAM HCL 2 MG/2ML IJ SOLN
INTRAMUSCULAR | Status: DC | PRN
  Administered 2022-04-24: 2 mg via INTRAVENOUS

## 2022-04-24 MED ORDER — ONDANSETRON HCL 4 MG/2ML IJ SOLN: 4.0000 mg | Freq: Once | INTRAMUSCULAR | Status: DC | PRN

## 2022-04-24 MED ORDER — LIDOCAINE HCL (CARDIAC) PF 100 MG/5ML IV SOSY
PREFILLED_SYRINGE | INTRAVENOUS | Status: DC | PRN
  Administered 2022-04-24: 100 mg via INTRAVENOUS

## 2022-04-24 MED ORDER — ROCURONIUM BROMIDE 10 MG/ML (PF) SYRINGE
PREFILLED_SYRINGE | INTRAVENOUS | Status: AC
  Filled 2022-04-24: qty 10

## 2022-04-24 MED ORDER — DEXMEDETOMIDINE HCL IN NACL 80 MCG/20ML IV SOLN
INTRAVENOUS | Status: DC | PRN
  Administered 2022-04-24: 8 ug via BUCCAL

## 2022-04-24 MED ORDER — ACETAMINOPHEN 10 MG/ML IV SOLN
INTRAVENOUS | Status: AC
  Filled 2022-04-24: qty 100

## 2022-04-24 MED ORDER — CEFAZOLIN SODIUM-DEXTROSE 2-4 GM/100ML-% IV SOLN
INTRAVENOUS | Status: AC
  Filled 2022-04-24: qty 100

## 2022-04-24 MED ORDER — FENTANYL CITRATE (PF) 100 MCG/2ML IJ SOLN: 25.0000 ug | INTRAMUSCULAR | Status: DC | PRN

## 2022-04-24 MED ORDER — FENTANYL CITRATE (PF) 100 MCG/2ML IJ SOLN
INTRAMUSCULAR | Status: AC
  Filled 2022-04-24: qty 2

## 2022-04-24 MED ORDER — DEXAMETHASONE SODIUM PHOSPHATE 10 MG/ML IJ SOLN
INTRAMUSCULAR | Status: AC
  Filled 2022-04-24: qty 1

## 2022-04-24 SURGICAL SUPPLY — 30 items
ADH LQ OCL WTPRF AMP STRL LF (MISCELLANEOUS)
ADHESIVE MASTISOL STRL (MISCELLANEOUS) IMPLANT
BAG DRAIN SIEMENS DORNER NS (MISCELLANEOUS) ×1 IMPLANT
BAG DRN NS LF (MISCELLANEOUS) ×1
BASKET ZERO TIP 1.9FR (BASKET) IMPLANT
BRUSH SCRUB EZ 1% IODOPHOR (MISCELLANEOUS) ×1 IMPLANT
BSKT STON RTRVL ZERO TP 1.9FR (BASKET) ×1
CATH URET FLEX-TIP 2 LUMEN 10F (CATHETERS) IMPLANT
CATH URETL OPEN 5X70 (CATHETERS) ×1 IMPLANT
CNTNR SPEC 2.5X3XGRAD LEK (MISCELLANEOUS) ×1
CONT SPEC 4OZ STRL OR WHT (MISCELLANEOUS) ×1
CONTAINER SPEC 2.5X3XGRAD LEK (MISCELLANEOUS) IMPLANT
DRAPE UTILITY 15X26 TOWEL STRL (DRAPES) ×1 IMPLANT
DRSG TEGADERM 2-3/8X2-3/4 SM (GAUZE/BANDAGES/DRESSINGS) IMPLANT
FIBER LASER MOSES 200 DFL (Laser) IMPLANT
GLOVE BIO SURGEON STRL SZ 6.5 (GLOVE) ×1 IMPLANT
GOWN STRL REUS W/ TWL LRG LVL3 (GOWN DISPOSABLE) ×2 IMPLANT
GOWN STRL REUS W/TWL LRG LVL3 (GOWN DISPOSABLE) ×2
GUIDEWIRE GREEN .038 145CM (MISCELLANEOUS) IMPLANT
GUIDEWIRE STR DUAL SENSOR (WIRE) ×1 IMPLANT
IV NS IRRIG 3000ML ARTHROMATIC (IV SOLUTION) ×1 IMPLANT
KIT TURNOVER CYSTO (KITS) ×1 IMPLANT
PACK CYSTO AR (MISCELLANEOUS) ×1 IMPLANT
SET CYSTO W/LG BORE CLAMP LF (SET/KITS/TRAYS/PACK) ×1 IMPLANT
SHEATH NAVIGATOR HD 12/14X36 (SHEATH) IMPLANT
STENT URET 6FRX24 CONTOUR (STENTS) IMPLANT
STENT URET 6FRX26 CONTOUR (STENTS) IMPLANT
SURGILUBE 2OZ TUBE FLIPTOP (MISCELLANEOUS) ×1 IMPLANT
TRAP FLUID SMOKE EVACUATOR (MISCELLANEOUS) ×1 IMPLANT
WATER STERILE IRR 500ML POUR (IV SOLUTION) ×1 IMPLANT

## 2022-04-24 NOTE — Transfer of Care (Signed)
Immediate Anesthesia Transfer of Care Note  Patient: Stephanie Cooper  Procedure(s) Performed: CYSTOSCOPY/URETEROSCOPY/HOLMIUM LASER/STENT EXCHANGE (Left)  Patient Location: PACU  Anesthesia Type:General  Level of Consciousness: awake  Airway & Oxygen Therapy: Patient Spontanous Breathing  Post-op Assessment: Report given to RN and Post -op Vital signs reviewed and stable  Post vital signs: Reviewed and stable  Last Vitals:  Vitals Value Taken Time  BP 131/63 04/24/22 1615  Temp 36.1 1615  Pulse 59 04/24/22 1616  Resp 15 04/24/22 1616  SpO2 93 % 04/24/22 1616  Vitals shown include unvalidated device data.  Last Pain:  Vitals:   04/24/22 1050  TempSrc: Temporal  PainSc: 0-No pain         Complications: No notable events documented.

## 2022-04-24 NOTE — Anesthesia Preprocedure Evaluation (Addendum)
Anesthesia Evaluation  Patient identified by MRN, date of birth, ID band Patient awake    Reviewed: Allergy & Precautions, NPO status , Patient's Chart, lab work & pertinent test results  History of Anesthesia Complications (+) PONV and history of anesthetic complications  Airway Mallampati: IV   Neck ROM: Full    Dental no notable dental hx.    Pulmonary neg pulmonary ROS   Pulmonary exam normal breath sounds clear to auscultation       Cardiovascular hypertension, + Past MI and +CHF (HOCM)  Normal cardiovascular exam+ dysrhythmias (a fib on Eliquis)  Rhythm:Regular Rate:Normal  ECG 03/28/22: normal  Cardiac MRI 07/19/21: Moderate LVH with LVOT gradient. There is faint patchy fibrosis. The pattern is a non-CAD pattern and can be seen in HCM but is otherwise nonspecific.   Echo 08/16/20:  NORMAL LEFT VENTRICULAR SYSTOLIC FUNCTION   WITH MODERATE LVH  NORMAL RIGHT VENTRICULAR SYSTOLIC FUNCTION  HYPERTROPHIC CARDIOMYOPATHY (HCM)  MODERATE MR  MILD TR  EF >55%    Neuro/Psych negative neurological ROS     GI/Hepatic hiatal hernia,GERD  ,,  Endo/Other  Obesity   Renal/GU Renal disease (nephrolithiasis)     Musculoskeletal   Abdominal   Peds  Hematology   Anesthesia Other Findings Cardiology note 11/08/21:  66 y.o. female with  1. Hypertrophic cardiomyopathy (CMS-HCC)  2. Paroxysmal atrial fibrillation (CMS-HCC)  3. Hyperlipidemia, mixed  4. PAF (paroxysmal atrial fibrillation) (CMS-HCC)  5. NSTEMI (non-ST elevated myocardial infarction) (CMS-HCC)  6. Chronic diastolic CHF (congestive heart failure) (CMS-HCC)  7. Essential hypertension  8. Mixed hyperlipidemia  9. Gastroesophageal reflux disease, unspecified whether esophagitis present   Plan  1 hypertrophic obstructive cardiomyopathy by history stable seen by Dr. Consuella Lose active asymptomatic continue current therapy 2 atrial fibrillation paroxysmal continue  metoprolol with Eliquis 3 congestive heart failure diastolic dysfunction unclear etiology possibly due to IHSS continue metoprolol losartan 4 hyperlipidemia continue diet and exercise consider statin therapy 5 hypertension reasonable control on losartan and metoprolol therapy 6 GERD continue famotidine consider adding omeprazole or Protonix 7 have the patient follow-up in 1 year  Return in about 1 year (around 11/09/2022).    Reproductive/Obstetrics                             Anesthesia Physical Anesthesia Plan  ASA: 4  Anesthesia Plan: General   Post-op Pain Management:    Induction: Intravenous  PONV Risk Score and Plan: 3 and Ondansetron, Dexamethasone, Treatment may vary due to age or medical condition, Propofol infusion and TIVA  Airway Management Planned: Oral ETT  Additional Equipment:   Intra-op Plan:   Post-operative Plan: Extubation in OR  Informed Consent: I have reviewed the patients History and Physical, chart, labs and discussed the procedure including the risks, benefits and alternatives for the proposed anesthesia with the patient or authorized representative who has indicated his/her understanding and acceptance.     Dental advisory given  Plan Discussed with: CRNA  Anesthesia Plan Comments: (Plan for TIVA for PONV ppx.  Patient consented for risks of anesthesia including but not limited to:  - adverse reactions to medications - damage to eyes, teeth, lips or other oral mucosa - nerve damage due to positioning  - sore throat or hoarseness - damage to heart, brain, nerves, lungs, other parts of body or loss of life  Informed patient about role of CRNA in peri- and intra-operative care.  Patient voiced understanding.)  Anesthesia Quick Evaluation  

## 2022-04-24 NOTE — Op Note (Signed)
Date of procedure: 04/24/22  Preoperative diagnosis:  Left kidney stones  Postoperative diagnosis:  Same as above  Procedure: Left ureteroscopy with laser lithotripsy Left ureteral stent exchange Left retrograde pyelogram Basket extraction of left kidney stones Interpretation of fluoroscopy less than 30 minutes  Surgeon: Hollice Espy, MD  Anesthesia: General  Complications: None  Intraoperative findings: Fair amount of dust in the mid and upper pole calyces, some of which required fragmentation to facilitate basketing.  In the end, only dust material remained no larger than the tip of the laser fiber in size.  EBL: Minimal  Specimens: Stone fragment  Drains: 6 x 24 French double-J ureteral stent on left  Indication: Stephanie Cooper is a 66 y.o. patient with large left staghorn type calculus who returns today for staged procedure to clear residual stone burden.  After reviewing the management options for treatment, she elected to proceed with the above surgical procedure(s). We have discussed the potential benefits and risks of the procedure, side effects of the proposed treatment, the likelihood of the patient achieving the goals of the procedure, and any potential problems that might occur during the procedure or recuperation. Informed consent has been obtained.  Description of procedure:  The patient was taken to the operating room and general anesthesia was induced.  The patient was placed in the dorsal lithotomy position, prepped and draped in the usual sterile fashion, and preoperative antibiotics were administered. A preoperative time-out was performed.   A 21 French scope was advanced per urethra into the bladder.  Attention was turned to the left UO where the ureteral stent was seen emanating.  I grasped the distal coil of the stent and brought to level the urethral meatus.  I elected to try to cannulate the stent but unsuccessfully so due to encrustation of the stent.   End of having to remove the stent.  Using open-ended ureteral catheter, was able to get a sensor wire up to the level of the kidney.  A dual-lumen ureteral access sheath was used to introduce a Super Stiff wire up to the level of the kidney which was used as a working wire.  The safety wire was snapped in place.  A 12/14 French ureteral access sheath was then advanced over the Super Stiff wire to the proximal ureter which went easily.  The inner cannula and Super Stiff wire were removed and I introduced a 8 digital flexible ureteroscope to the level of the kidney.  Formal pyeloscopy revealed several residual stones, up to 8 mm in the midpole calyx.  I used dusting settings of 0.3 J and 60 Hz to dust the majority of the stones.  The larger stones were extracted using 1.9 French tipless nitinol basket.  This was done over the course of an hour or so.  At the end of the hour, nothing remain other than some small dust material which was approximately the size of the tip of the laser fiber and nothing significant.  A final retrograde pyelogram on the side outlined the collecting system which was mildly dilated without contrast extravasation.  This created a roadmap to ensure that each and every calyx was directly visualized and no significant residual stone burden remained.  I then scoped the length of the ureter while removing the ureteral access sheath.  Minimal ureteral injury was appreciated.  A 6 x 24 French double-J ureteral stent was advanced over the safety wire up to the level of the kidney.  Upon wire withdrawal, there was a  full coil noted within the renal pelvis as well as within the bladder.  The bladder was then drained.  The patient was then cleaned and dried, repositioned in supine position, reversed of anesthesia, taken the PACU in stable condition.  Plan: Will have her return next week for ureteral stent removal.  I was unable to reach her husband left him a voicemail outlining the details of the  procedure today.  Stephanie Cooper, M.D.

## 2022-04-24 NOTE — Discharge Instructions (Addendum)
You have a ureteral stent in place.  This is a tube that extends from your kidney to your bladder.  This may cause urinary bleeding, burning with urination, and urinary frequency.  Please call our office or present to the ED if you develop fevers >101 or pain which is not able to be controlled with oral pain medications.  You may be given either Flomax and/ or ditropan to help with bladder spasms and stent pain in addition to pain medications.    [facility] [address], [address] [place], [place] [address] [phone]  AMBULATORY SURGERY  DISCHARGE INSTRUCTIONS   The drugs that you were given will stay in your system until tomorrow so for the next 24 hours you should not:  Drive an automobile Make any legal decisions Drink any alcoholic beverage   You may resume regular meals tomorrow.  Today it is better to start with liquids and gradually work up to solid foods.  You may eat anything you prefer, but it is better to start with liquids, then soup and crackers, and gradually work up to solid foods.   Please notify your doctor immediately if you have any unusual bleeding, trouble breathing, redness and pain at the surgery site, drainage, fever, or pain not relieved by medication.    Additional Instructions:   Please contact your physician with any problems or Same Day Surgery at [phone], Monday through Friday 6 am to 4 pm, or [facility] Main number at [phone].  

## 2022-04-24 NOTE — Anesthesia Procedure Notes (Signed)
Procedure Name: Intubation Date/Time: 04/24/2022 2:49 PM  Performed by: Morene Crocker, CRNAPre-anesthesia Checklist: Patient identified, Patient being monitored, Timeout performed, Emergency Drugs available and Suction available Patient Re-evaluated:Patient Re-evaluated prior to induction Oxygen Delivery Method: Circle system utilized Preoxygenation: Pre-oxygenation with 100% oxygen Induction Type: IV induction Ventilation: Mask ventilation without difficulty Laryngoscope Size: 3 and McGraph Grade View: Grade I Tube type: Oral Tube size: 7.0 mm Number of attempts: 1 Airway Equipment and Method: Stylet Placement Confirmation: ETT inserted through vocal cords under direct vision, positive ETCO2 and breath sounds checked- equal and bilateral Secured at: 21 cm Tube secured with: Tape Dental Injury: Teeth and Oropharynx as per pre-operative assessment

## 2022-04-24 NOTE — H&P (Signed)
04/24/22 RRR CTAB  Returns today for staged procedure  HPI: 66 year old female who presents today for further evaluation of gross hematuria and a large staghorn calculus with hydronephrosis.   She reports that she has been having some intermittent gross hematuria which is very worrisome to her.  She was seen and evaluated by her PCP, Dr. Caryl Comes and underwent a CT stone protocol on 02/23/2022.   CT scan indicated left renal atrophy with cortical thinning and dilated upper pole cystoscopy with greater than 3.2 cm left staghorn calculus.   She has had abnormal urinalyses for years including with blood.  She does have some dull left flank pain she thought was related to her back.   UA today with >30 WBC, 11-30 RBC, moderate bacteria, nitrite negative.  No dysuria or gross hematuria.  No fevers or chills.   She does have a personal history of nephrolithiasis and had a shockwave lithotripsy several decades ago.     PMH:     Past Medical History:  Diagnosis Date   Anemia     Chronic diastolic (congestive) heart failure ([place]) 2017    GRADE 2-saw [place] in 2020-he states for pt to just f/u as needed   Complication of anesthesia     Family history of adverse reaction to anesthesia      DAUGHTER-N/V   GERD (gastroesophageal reflux disease)     History of kidney stones      H/O   Hypertension     PONV (postoperative nausea and vomiting)      N/V   Scoliosis     Vertigo 2012    X1      Surgical History:      Past Surgical History:  Procedure Laterality Date   DIAGNOSTIC LAPAROSCOPY   1990   DILATATION & CURETTAGE/HYSTEROSCOPY WITH MYOSURE N/A 12/05/2019    Procedure: [place];  Surgeon: Schermerhorn, Gwen Her, MD;  Location: [facility] ORS;  Service: Gynecology;  Laterality: N/A;   LEFT HEART CATH AND CORONARY ANGIOGRAPHY N/A 03/28/2021    Procedure: LEFT HEART CATH AND CORONARY ANGIOGRAPHY;  Surgeon: Corey Skains, MD;  Location:  [facility];  Service: Cardiovascular;  Laterality: N/A;   LITHOTRIPSY          Home Medications:  Allergies as of 03/08/2022         Reactions    Lisinopril Cough    Prednisone Other (See Comments)    Elevated blood pressure Felt like weight on her chest            Medication List           Accurate as of March 08, 2022 11:59 PM. If you have any questions, ask your nurse or doctor.              STOP taking these medications     Calcium Carbonate-Vitamin D 600-400 MG-UNIT tablet Stopped by: Hollice Espy, MD    verapamil 120 MG CR tablet Commonly known as: CALAN-SR Stopped by: Hollice Espy, MD           TAKE these medications     apixaban 5 MG Tabs tablet Commonly known as: ELIQUIS Take 1 tablet (5 mg total) by mouth 2 (two) times daily.    famotidine 20 MG tablet Commonly known as: PEPCID Take 20 mg by mouth 2 (two) times daily.    losartan 25 MG tablet Commonly known as: COZAAR Take 25 mg by mouth every morning.  metoprolol succinate 25 MG 24 hr tablet Commonly known as: TOPROL-XL Take 25 mg by mouth daily.    montelukast 10 MG tablet Commonly known as: SINGULAIR Take 10 mg by mouth every evening.             Allergies:       Allergies  Allergen Reactions   Lisinopril Cough   Prednisone Other (See Comments)      Elevated blood pressure Felt like weight on her chest      Family History:      Family History  Problem Relation Age of Onset   Diverticulitis Mother     Atrial fibrillation Mother     Diabetes Father     Breast cancer Neg Hx        Social History:  reports that she has never smoked. She has never used smokeless tobacco. She reports current alcohol use. She reports that she does not use drugs.     Physical Exam: BP (!) 142/86   Pulse 91   Ht 5\' 7"  (1.702 m)   Wt 201 lb (91.2 kg)   BMI 31.48 kg/m   Constitutional:  Alert and oriented, No acute distress. HEENT: [place] AT, moist mucus membranes.  Trachea  midline, no masses. Cardiovascular: No clubbing, cyanosis, or edema. Respiratory: Normal respiratory effort, no increased work of breathing. GI: Abdomen is soft, nontender, nondistended, no abdominal masses GU: No CVA tenderness Skin: No rashes, bruises or suspicious lesions. Neurologic: Grossly intact, no focal deficits, moving all 4 extremities. Psychiatric: Normal mood and affect.   Laboratory Data: Recent Labs       Lab Results  Component Value Date    WBC 6.6 03/28/2021    HGB 11.3 (L) 03/28/2021    HCT 35.6 (L) 03/28/2021    MCV 87.9 03/28/2021    PLT 254 03/28/2021        Recent Labs       Lab Results  Component Value Date    CREATININE 0.79 03/28/2021        Recent Labs       Lab Results  Component Value Date    HGBA1C 5.9 08/18/2015        Urinalysis Labs (Brief)          Component Value Date/Time    APPEARANCEUR Hazy (A) 03/08/2022 1455    GLUCOSEU Negative 03/08/2022 1455    BILIRUBINUR Negative 03/08/2022 1455    PROTEINUR Trace (A) 03/08/2022 1455    NITRITE Negative 03/08/2022 1455    LEUKOCYTESUR 2+ (A) 03/08/2022 1455        Recent Labs       Lab Results  Component Value Date    LABMICR See below: 03/08/2022    WBCUA >30 (A) 03/08/2022    LABEPIT 0-10 03/08/2022    BACTERIA Moderate (A) 03/08/2022        Pertinent Imaging: CT RENAL STONE STUDY   Narrative CLINICAL DATA:  Gross hematuria for 3 weeks, history of kidney stones   EXAM: CT ABDOMEN AND PELVIS WITHOUT CONTRAST   TECHNIQUE: Multidetector CT imaging of the abdomen and pelvis was performed following the standard protocol without IV contrast.   RADIATION DOSE REDUCTION: This exam was performed according to the departmental dose-optimization program which includes automated exposure control, adjustment of the mA and/or kV according to patient size and/or use of iterative reconstruction technique.   COMPARISON:  09/17/2009   FINDINGS: Lower chest: Lung bases  emphysematous but clear. Minimal enlargement of cardiac chambers. Small  pericardial effusion.   Hepatobiliary: Gallbladder and liver normal appearance   Pancreas: Normal appearance   Spleen: Normal appearance   Adrenals/Urinary Tract: Adrenal glands normal appearance. Tiny calculus at inferior pole RIGHT kidney, RIGHT kidney otherwise normal appearance. LEFT kidney atrophic with cortical thinning and an upper pole cyst 2.7 x 2.2 cm. Multiple LEFT renal calculi including staghorn calculus 3.2 x 3.1 x 2.9 cm. Mild dilatation of upper pole collecting system LEFT kidney. No renal pelvic dilatation. No hydroureter. No ureteral calcifications. Bladder unremarkable.   Stomach/Bowel: Normal appendix. Moderate-sized hiatal hernia. Stomach and bowel loops otherwise normal appearance. No bowel wall thickening, dilatation or obstruction.   Vascular/Lymphatic: Few pelvic phleboliths. Aorta normal caliber. No adenopathy.   Reproductive: Atrophic uterus and ovaries   Other: No free air or free fluid. No hernia. No inflammatory process.   Musculoskeletal: Osseous demineralization. Degenerative changes and levoconvex scoliosis of thoracolumbar spine.   IMPRESSION: Atrophic LEFT kidney with cortical thinning and an upper pole cyst 2.7 x 2.2 cm.   Multiple LEFT renal calculi including staghorn calculus 3.2 x 3.1 x 2.9 cm associated with mild dilatation of upper pole collecting system LEFT kidney.   Tiny nonobstructing RIGHT renal calculus.   Moderate-sized hiatal hernia.   Small pericardial effusion.   No acute intra-abdominal or intrapelvic abnormalities.     Electronically Signed By: Lavonia Dana M.D. On: 02/23/2022 08:44   The above CT scan was reviewed personally today and also was able to share the images with Ms. Isaiah Blakes.   Assessment & Plan:     1. Left nephrolithiasis Large left staghorn calculus with upper pole obstruction   We discussed that although there is some  renal atrophy on the side, there is enough parenchymal that repeating the stone would leave the salvageable kidney.   Specifically today, we discussed ureteroscopy versus PCNL.  We discussed the stone free rates with each, certainly if we proceed with PCNL, much higher stone free clearance rate however more invasive.  Could also consider staged ureteroscopy's which would require several procedures likely to clear all stone burden.   We discussed the risk and benefits of each along with the risk of bleeding, infection, damage surrounding structures, need for further procedures, need for stent amongst others.   Ultimately, she believes that she wants to proceed with staged ureteroscopy   She had a cardiac clearance to hold Eliquis. - Urinalysis, Complete - CULTURE, URINE COMPREHENSIVE   2. Gross hematuria Likely secondary to #2; cysto at the time of #1       Hollice Espy, MD   [facility] [address] [place], [place] [address] [phone]

## 2022-04-26 ENCOUNTER — Encounter: Payer: Self-pay | Admitting: Urology

## 2022-05-01 LAB — CALCULI, WITH PHOTOGRAPH (CLINICAL LAB)
Calcium Oxalate Dihydrate: 20 %
Calcium Oxalate Monohydrate: 70 %
Hydroxyapatite: 10 %
Weight Calculi: 55 mg

## 2022-05-01 NOTE — Anesthesia Postprocedure Evaluation (Signed)
Anesthesia Post Note  Patient: Stephanie Cooper  Procedure(s) Performed: CYSTOSCOPY/URETEROSCOPY/HOLMIUM LASER/STENT EXCHANGE (Left)  Patient location during evaluation: PACU Anesthesia Type: General Level of consciousness: awake and alert Pain management: pain level controlled Vital Signs Assessment: post-procedure vital signs reviewed and stable Respiratory status: spontaneous breathing, nonlabored ventilation, respiratory function stable and patient connected to nasal cannula oxygen Cardiovascular status: blood pressure returned to baseline and stable Postop Assessment: no apparent nausea or vomiting Anesthetic complications: no   No notable events documented.   Last Vitals:  Vitals:   04/24/22 1653 04/24/22 1704  BP: (!) 152/60 (!) 149/63  Pulse: (!) 52 (!) 55  Resp: 18 15  Temp: 36.7 C (!) 36.1 C  SpO2: 100% 100%    Last Pain:  Vitals:   04/25/22 0910  TempSrc:   PainSc: 2                  Lenard Simmer

## 2022-05-02 ENCOUNTER — Ambulatory Visit (INDEPENDENT_AMBULATORY_CARE_PROVIDER_SITE_OTHER): Payer: BC Managed Care – PPO | Admitting: Urology

## 2022-05-02 ENCOUNTER — Encounter: Payer: Self-pay | Admitting: Urology

## 2022-05-02 VITALS — BP 146/83 | HR 58 | Ht 67.0 in | Wt 195.0 lb

## 2022-05-02 DIAGNOSIS — Z466 Encounter for fitting and adjustment of urinary device: Secondary | ICD-10-CM | POA: Diagnosis not present

## 2022-05-02 DIAGNOSIS — N200 Calculus of kidney: Secondary | ICD-10-CM

## 2022-05-02 LAB — URINALYSIS, COMPLETE
Bilirubin, UA: NEGATIVE
Glucose, UA: NEGATIVE
Ketones, UA: NEGATIVE
Nitrite, UA: NEGATIVE
Specific Gravity, UA: 1.02 (ref 1.005–1.030)
Urobilinogen, Ur: 0.2 mg/dL (ref 0.2–1.0)
pH, UA: 7 (ref 5.0–7.5)

## 2022-05-02 LAB — MICROSCOPIC EXAMINATION
RBC, Urine: >30 /hpf — AB (ref 0–2)
WBC, UA: >30 /hpf — AB (ref 0–5)

## 2022-05-02 MED ORDER — CEPHALEXIN 250 MG PO CAPS
500.0000 mg | ORAL_CAPSULE | Freq: Once | ORAL | Status: AC
  Administered 2022-05-02: 500 mg via ORAL

## 2022-05-02 NOTE — Progress Notes (Signed)
   05/02/22  CC:  Chief Complaint  Patient presents with   Procedure    Stent removal    HPI: 66 year old female with a personal history of large volume left renal stone status post staged ureteroscopy who returns today for cystoscopy, stent removal.  Today, she is having some stent irritation otherwise no issues.  Blood pressure (!) 146/83, pulse (!) 58, height 5\' 7"  (1.702 m), weight 195 lb (88.5 kg). NED. A&Ox3.   No respiratory distress   Abd soft, NT, ND Normal external genitalia with patent urethral meatus  Cystoscopy/ Stent removal procedure  Patient identification was confirmed, informed consent was obtained, and patient was prepped using Betadine solution.  Lidocaine jelly was administered per urethral meatus.    Preoperative abx where received prior to procedure.    Procedure: - Flexible cystoscope introduced, without any difficulty.   - Thorough search of the bladder revealed:    normal urethral meatus  Stent seen emanating from left ureteral orifice, grasped with stent graspers, and removed in entirety.    Post-Procedure: - Patient tolerated the procedure well  She is given a single dose of Keflex   Assessment/ Plan:  1. Left nephrolithiasis Status post staged left ureteroscopy  Stent removed today without difficulty  Discussed postprocedural expectations and warning symptoms  Follow-up in 6 weeks with renal ultrasound prior - Urinalysis, Complete - Renal Limited; Future - cephALEXin (KEFLEX) capsule 500 mg    [place], MD

## 2022-05-17 ENCOUNTER — Other Ambulatory Visit: Payer: Self-pay | Admitting: Urology

## 2022-06-07 ENCOUNTER — Ambulatory Visit
Admission: RE | Admit: 2022-06-07 | Discharge: 2022-06-07 | Disposition: A | Discharge status: Home / Self Care | Payer: BC Managed Care – PPO | Source: Ambulatory Visit | Attending: Urology | Admitting: Urology

## 2022-06-07 DIAGNOSIS — N200 Calculus of kidney: Secondary | ICD-10-CM | POA: Insufficient documentation

## 2022-06-13 ENCOUNTER — Ambulatory Visit
Admission: RE | Admit: 2022-06-13 | Discharge: 2022-06-13 | Disposition: A | Discharge status: Home / Self Care | Payer: BC Managed Care – PPO | Attending: Urology | Admitting: Urology

## 2022-06-13 ENCOUNTER — Ambulatory Visit: Payer: BC Managed Care – PPO | Admitting: Urology

## 2022-06-13 ENCOUNTER — Ambulatory Visit
Admission: RE | Admit: 2022-06-13 | Discharge: 2022-06-13 | Disposition: A | Discharge status: Home / Self Care | Payer: BC Managed Care – PPO | Source: Ambulatory Visit | Attending: Urology | Admitting: Urology

## 2022-06-13 VITALS — BP 145/84 | HR 60 | Ht 67.0 in | Wt 200.5 lb

## 2022-06-13 DIAGNOSIS — N200 Calculus of kidney: Secondary | ICD-10-CM

## 2022-06-13 NOTE — Progress Notes (Signed)
I, [name],acting as a scribe for Hollice Espy, MD.,have documented all relevant documentation on the behalf of Hollice Espy, MD,as directed by  Hollice Espy, MD while in the presence of Hollice Espy, MD.   I, Amy L Pierron,acting as a scribe for Hollice Espy, MD.,have documented all relevant documentation on the behalf of Hollice Espy, MD,as directed by  Hollice Espy, MD while in the presence of Hollice Espy, MD.   06/13/22 10:23 AM   Stephanie Cooper 1955-12-22 [id number]  Referring provider: Adin Hector, MD [place] [facility] [place],  [place] [address]  Chief Complaint  Patient presents with   Nephrolithiasis    Follow up     HPI: 67 year-old female with a personal history of a very large left staghorn calculus who underwent staged ureteroscopy in November 2023 and subsequently in December 2023 which created a large amount of dust. She had her stent removed in December.   The renal ultrasound from 06/07/22 shows no residual hydronephrosis. She does have conglomerate of probable dust spanning 3.6 centimeters. Stone composition was 70% calcium oxalate monohydrate, 20% calcium oxalate dihydrate and 10% hydroapatite.   She denies any recent kidney pain. She has not noticed any debris passing. She was taken off of calcium supplements.  She does report prior to this episode, she had been taking a lot of Tums for the purpose of reflux related to her hiatal hernia.  She thinks that this is a contributing factor.  She also does not drink of water.  Notably, she has no previous history of kidney stones, presented with painless gross hematuria.   PMH: Past Medical History:  Diagnosis Date   Anemia    Chronic diastolic (congestive) heart failure ([place]) 08/18/2015   a.) TTE 08/18/2015: EF 65-70%, mild LVH, LAE, triv MR, triv TR; b.) TTE 06/24/2018: EF >55%, mod LVH, mild BAE, triv AR/PR, mod MR/TR, G1DD; c.) TTE 08/16/2020: EF >55%, mod LVH,  mild TR, mod MR; d.) TTE 03/28/2021: EF 70-75%, mod SAM, LAE, mod MR, T7SV   Complication of anesthesia    Family history of adverse reaction to anesthesia    a.) PONV in 1st degree relative (daughter)   GERD (gastroesophageal reflux disease)    History of hiatal hernia    History of kidney stones    Hypertension    Hypertrophic obstructive cardiomyopathy (HCC)    a.) no pathogenic variants; b.) NYHA class I-II symptoms with severe resting gradients (100-122 mmHg).   NSTEMI (non-ST elevated myocardial infarction) ([place]) 03/27/2021   a.) troponins trended: 36 --> 89 --> 199 --> 231 --> 212; b.) LHC 03/28/2021: EF 75%, mildly elevated LVEDP, mod MR, normal coronaries/no CAD   Paroxysmal atrial fibrillation (HCC)    a.) CHA2DS2VASc = 4 (age, sex, CHF, HTN);  b.) rate/rhythm maintained on oral metoprolol succinate; chronically anticoagulated with apixaban   PONV (postoperative nausea and vomiting)    Scoliosis    Vertigo 2012   X1    Surgical History: Past Surgical History:  Procedure Laterality Date   COLONOSCOPY     CYSTOSCOPY/URETEROSCOPY/HOLMIUM LASER/STENT PLACEMENT Left 03/27/2022   Procedure: CYSTOSCOPY/URETEROSCOPY/HOLMIUM LASER/STENT PLACEMENT;  Surgeon: Hollice Espy, MD;  Location: [facility];  Service: Urology;  Laterality: Left;   CYSTOSCOPY/URETEROSCOPY/HOLMIUM LASER/STENT PLACEMENT Left 04/24/2022   Procedure: CYSTOSCOPY/URETEROSCOPY/HOLMIUM LASER/STENT EXCHANGE;  Surgeon: Hollice Espy, MD;  Location: [facility];  Service: Urology;  Laterality: Left;   [place] N/A 12/05/2019  Procedure: [place];  Surgeon: Schermerhorn, Gwen Her, MD;  Location: [facility];  Service: Gynecology;  Laterality: N/A;   LEFT HEART CATH AND CORONARY ANGIOGRAPHY N/A 03/28/2021   Procedure: LEFT HEART CATH AND CORONARY ANGIOGRAPHY;  Surgeon: Corey Skains, MD;  Location: [facility];  Service: Cardiovascular;  Laterality: N/A;   LITHOTRIPSY      Home Medications:  Allergies as of 06/13/2022       Reactions   Lisinopril Cough   Prednisone Other (See Comments)   Elevated blood pressure Felt like weight on her chest        Medication List        Accurate as of June 13, 2022 10:23 AM. If you have any questions, ask your nurse or doctor.          STOP taking these medications    tamsulosin 0.4 MG Caps capsule Commonly known as: Flomax       TAKE these medications    apixaban 5 MG Tabs tablet Commonly known as: ELIQUIS Take 1 tablet (5 mg total) by mouth 2 (two) times daily.   famotidine 20 MG tablet Commonly known as: PEPCID Take 20 mg by mouth 2 (two) times daily.   losartan 25 MG tablet Commonly known as: COZAAR Take 25 mg by mouth every morning.   metoprolol succinate 25 MG 24 hr tablet Commonly known as: TOPROL-XL Take 25 mg by mouth every morning.   montelukast 10 MG tablet Commonly known as: SINGULAIR Take 10 mg by mouth every evening.   VITAMIN D2 PO Take 1 tablet by mouth daily at 6 (six) AM.        Allergies:  Allergies  Allergen Reactions   Lisinopril Cough   Prednisone Other (See Comments)    Elevated blood pressure Felt like weight on her chest    Family History: Family History  Problem Relation Age of Onset   Diverticulitis Mother    Atrial fibrillation Mother    Diabetes Father    Breast cancer Neg Hx     Social History:  reports that she has never smoked. She has never used smokeless tobacco. She reports current alcohol use. She reports that she does not use drugs.   Physical Exam: BP (!) 145/84   Pulse 60   Ht 5\' 7"  (1.702 m)   Wt 200 lb 8 oz (90.9 kg)   BMI 31.40 kg/m   Constitutional:  Alert and oriented, No acute distress. HEENT: [place] AT, moist mucus membranes.  Trachea midline, no masses. Neurologic: Grossly intact, no focal deficits, moving all 4  extremities. Psychiatric: Normal mood and affect.  Pertinent Imaging:  Results for orders placed during the hospital encounter of 06/07/22  US RENAL  Narrative CLINICAL DATA:  left nephrolithiasis  EXAM: RENAL / URINARY TRACT ULTRASOUND COMPLETE  COMPARISON:  February 22, 2022  FINDINGS: Right Kidney:  Renal measurements: 9.4 x 4.6 x 5.3 cm = volume: 119 mL. Echogenicity within normal limits. No mass or hydronephrosis visualized. Cortical thinning is noted.  Left Kidney:  Renal measurements: 9.7 x 4.7 x 4.6 cm = volume: 100 a mL. Echogenicity within normal limits. No hydronephrosis visualized. Cortical thinning is noted. In the inferior pole, there are 2 adjacent echogenic foci with posterior acoustic shadowing. Together they span approximately 3.6 cm. Additional echogenic focus in the interpolar kidney measures approximately 12 mm. There is a previously characterized benign simple cyst of the superior pole measuring 24 x 21 x 26  mm (for which no dedicated imaging follow-up is recommended).  Bladder:  Appears normal for degree of bladder distention.  Other:  None.  IMPRESSION: 1. No hydronephrosis. 2. Multiple nonobstructing LEFT-sided nephrolithiasis, a conglomeration of which spans approximately 3.6 cm. 3. Bilateral renal cortical thinning is noted, as can be seen in the setting of medical renal disease.   Electronically Signed By: Valentino Saxon M.D. On: 06/07/2022 14:33  Personally reviewed today and agree with the radiologic interpretation.  Assessment & Plan:    Left Nephrolithiasis - Status post staged ureteroscopy for large staghorn type stone. She has residual significant debris on renal ultrasound but resolution of hydronephrosis means no further obstruction.  - We'll get a KUB today to assess the nature and volume of the debris and have her follow up in six months with another KUB, anticipating she'll continue to pass fragments and debris as  time goes on.  -Depending on residual stone volume, may require further intervention down the road - Declined 24 hour metabolic evaluation for now but discussed indications for this in the future if she has evidence of metabolically active stone disease - We discussed general stone prevention techniques including drinking plenty water with goal of producing 2.5 L urine daily, increased citric acid intake, avoidance of high oxalate containing foods, and decreased salt intake.  Information about dietary recommendations given today.     Return in about 6 months (around 12/12/2022) for KUB xray.  I have reviewed the above documentation for accuracy and completeness, and I agree with the above.   Hollice Espy, MD   [facility] [address] [place], [place] [address] [phone]

## 2022-11-24 ENCOUNTER — Encounter: Payer: Self-pay | Admitting: Urology

## 2022-12-15 ENCOUNTER — Ambulatory Visit
Admission: RE | Admit: 2022-12-15 | Discharge: 2022-12-15 | Disposition: A | Discharge status: Home / Self Care | Payer: BC Managed Care – PPO | Source: Ambulatory Visit | Attending: Urology | Admitting: Urology

## 2022-12-15 ENCOUNTER — Ambulatory Visit
Admission: RE | Admit: 2022-12-15 | Discharge: 2022-12-15 | Disposition: A | Discharge status: Home / Self Care | Payer: BC Managed Care – PPO | Attending: Urology | Admitting: Urology

## 2022-12-15 DIAGNOSIS — N200 Calculus of kidney: Secondary | ICD-10-CM | POA: Diagnosis not present

## 2022-12-19 ENCOUNTER — Ambulatory Visit: Payer: BC Managed Care – PPO | Admitting: Urology

## 2022-12-19 VITALS — BP 155/78 | HR 64 | Ht 67.0 in | Wt 205.4 lb

## 2022-12-19 DIAGNOSIS — Z87442 Personal history of urinary calculi: Secondary | ICD-10-CM | POA: Diagnosis not present

## 2022-12-19 DIAGNOSIS — N200 Calculus of kidney: Secondary | ICD-10-CM

## 2022-12-19 DIAGNOSIS — Z09 Encounter for follow-up examination after completed treatment for conditions other than malignant neoplasm: Secondary | ICD-10-CM | POA: Diagnosis not present

## 2022-12-19 NOTE — Progress Notes (Signed)
I, [name],acting as a scribe for Vanna Scotland, MD.,have documented all relevant documentation on the behalf of Vanna Scotland, MD,as directed by  Vanna Scotland, MD while in the presence of Vanna Scotland, MD.   12/19/22 3:47 PM   Stephanie Cooper 16-Mar-1956 [id number]  Referring provider: Lynnea Ferrier, MD [address] [facility] [place],  [place] [address]  Chief Complaint  Patient presents with   Follow-up   Nephrolithiasis    HPI: 67 year-old female who underwent staged ureteroscopy for a large staghorn calculus on the left in November of 2023 and December of 2023, she had a large amount of dust material.   She had a renal ultrasound that showed no residual hydronephrosis, but she did have a large amount of residual stone debris and dust spanning at least 3.6 centimeters. Stone composition was 70% calcium oxalate monohydrate, 20% calcium oxalate dihydrate, and 10% calcium apatite. KUB today shows decreased overall stone burden as compared to January, presumably, related to interval passage of debris. She does have some layering versus residual stone in the left mid and lower pole.   She is asymptomatic and reports no issues with infections or kidney blockage. She reports nocturia x 2. She drinks plenty of water, coffee in the morning, minimal soda intake. She uses lemonade powder for citric acid intake.    PMH: Past Medical History:  Diagnosis Date   Anemia    Chronic diastolic (congestive) heart failure (HCC) 08/18/2015   a.) TTE 08/18/2015: EF 65-70%, mild LVH, LAE, triv MR, triv TR; b.) TTE 06/24/2018: EF >55%, mod LVH, mild BAE, triv AR/PR, mod MR/TR, G1DD; c.) TTE 08/16/2020: EF >55%, mod LVH, mild TR, mod MR; d.) TTE 03/28/2021: EF 70-75%, mod SAM, LAE, mod MR, G2DD   Complication of anesthesia    Family history of adverse reaction to anesthesia    a.) PONV in 1st degree relative (daughter)   GERD (gastroesophageal reflux disease)    History  of hiatal hernia    History of kidney stones    Hypertension    Hypertrophic obstructive cardiomyopathy (HCC)    a.) no pathogenic variants; b.) NYHA class I-II symptoms with severe resting gradients (100-122 mmHg).   NSTEMI (non-ST elevated myocardial infarction) (HCC) 03/27/2021   a.) troponins trended: 36 --> 89 --> 199 --> 231 --> 212; b.) LHC 03/28/2021: EF 75%, mildly elevated LVEDP, mod MR, normal coronaries/no CAD   Paroxysmal atrial fibrillation (HCC)    a.) CHA2DS2VASc = 4 (age, sex, CHF, HTN);  b.) rate/rhythm maintained on oral metoprolol succinate; chronically anticoagulated with apixaban   PONV (postoperative nausea and vomiting)    Scoliosis    Vertigo 2012   X1    Surgical History: Past Surgical History:  Procedure Laterality Date   COLONOSCOPY     CYSTOSCOPY/URETEROSCOPY/HOLMIUM LASER/STENT PLACEMENT Left 03/27/2022   Procedure: CYSTOSCOPY/URETEROSCOPY/HOLMIUM LASER/STENT PLACEMENT;  Surgeon: Vanna Scotland, MD;  Location: [facility];  Service: Urology;  Laterality: Left;   CYSTOSCOPY/URETEROSCOPY/HOLMIUM LASER/STENT PLACEMENT Left 04/24/2022   Procedure: CYSTOSCOPY/URETEROSCOPY/HOLMIUM LASER/STENT EXCHANGE;  Surgeon: Vanna Scotland, MD;  Location: [facility];  Service: Urology;  Laterality: Left;   DIAGNOSTIC LAPAROSCOPY  1990   DILATATION & CURETTAGE/HYSTEROSCOPY WITH MYOSURE N/A 12/05/2019   Procedure: FRACTIONAL DILATATION & CURETTAGE/HYSTEROSCOPY WITH MYOSURE;  Surgeon: Schermerhorn, Ihor Austin, MD;  Location: [facility];  Service: Gynecology;  Laterality: N/A;   LEFT HEART CATH AND CORONARY ANGIOGRAPHY N/A 03/28/2021   Procedure: LEFT HEART CATH AND CORONARY ANGIOGRAPHY;  Surgeon: Gwen Pounds,  Quentin Cornwall, MD;  Location: [facility];  Service: Cardiovascular;  Laterality: N/A;   LITHOTRIPSY      Home Medications:  Allergies as of 12/19/2022       Reactions   Lisinopril Cough   Prednisone Other (See Comments)   Elevated blood pressure Felt like weight on her  chest        Medication List        Accurate as of December 19, 2022  3:47 PM. If you have any questions, ask your nurse or doctor.          apixaban 5 MG Tabs tablet Commonly known as: ELIQUIS Take 1 tablet (5 mg total) by mouth 2 (two) times daily.   famotidine 20 MG tablet Commonly known as: PEPCID Take 20 mg by mouth 2 (two) times daily.   losartan 25 MG tablet Commonly known as: COZAAR Take 25 mg by mouth every morning.   metoprolol succinate 25 MG 24 hr tablet Commonly known as: TOPROL-XL Take 25 mg by mouth every morning.   montelukast 10 MG tablet Commonly known as: SINGULAIR Take 10 mg by mouth every evening.   VITAMIN D2 PO Take 1 tablet by mouth daily at 6 (six) AM.        Allergies:  Allergies  Allergen Reactions   Lisinopril Cough   Prednisone Other (See Comments)    Elevated blood pressure Felt like weight on her chest    Family History: Family History  Problem Relation Age of Onset   Diverticulitis Mother    Atrial fibrillation Mother    Diabetes Father    Breast cancer Neg Hx     Social History:  reports that she has never smoked. She has never used smokeless tobacco. She reports current alcohol use. She reports that she does not use drugs.   Physical Exam: BP (!) 155/78   Pulse 64   Ht 5\' 7"  (1.702 m)   Wt 205 lb 6 oz (93.2 kg)   BMI 32.17 kg/m   Constitutional:  Alert and oriented, No acute distress. HEENT: [place] AT, moist mucus membranes.  Trachea midline, no masses. Neurologic: Grossly intact, no focal deficits, moving all 4 extremities. Psychiatric: Normal mood and affect.  Pertinent Imaging: KUB today shows decreased overall stone burden as compared to January, presumably, related to interval passage of debris. She does have some layering versus residual stone in the left mid and lower pole. Awaiting radiologic interpretation.   Assessment & Plan:    Left nephrolithiasis -Asymptomatic -We discussed general stone  prevention techniques including drinking plenty water with goal of producing 2.5 L urine daily, increased citric acid intake, avoidance of high oxalate containing foods, and decreased salt intake.  Information about dietary recommendations given today.   -Monitor with KUB in 1 year  Return in about 1 year (around 12/19/2023) for KUB.  I have reviewed the above documentation for accuracy and completeness, and I agree with the above.   Vanna Scotland, MD    [facility] [address] [place], [place] [address] [phone]

## 2023-05-21 ENCOUNTER — Other Ambulatory Visit: Payer: Self-pay | Admitting: Internal Medicine

## 2023-05-21 DIAGNOSIS — Z1231 Encounter for screening mammogram for malignant neoplasm of breast: Secondary | ICD-10-CM

## 2023-06-01 ENCOUNTER — Ambulatory Visit
Admission: RE | Admit: 2023-06-01 | Discharge: 2023-06-01 | Disposition: A | Discharge status: Home / Self Care | Payer: 59 | Source: Ambulatory Visit | Attending: Internal Medicine | Admitting: Internal Medicine

## 2023-06-01 DIAGNOSIS — Z1231 Encounter for screening mammogram for malignant neoplasm of breast: Secondary | ICD-10-CM | POA: Diagnosis present

## 2023-06-12 IMAGING — MG MM DIGITAL SCREENING BILAT W/ TOMO AND CAD
8 series · 8 of 24 positions shown · non-contrast
Comparison: Previous exam(s).

ACR Breast Density Category a: The breast tissue is almost entirely
fatty.

CLINICAL DATA: Screening.

EXAM:
DIGITAL SCREENING BILATERAL MAMMOGRAM WITH TOMOSYNTHESIS AND CAD
TECHNIQUE: Bilateral screening digital craniocaudal and mediolateral oblique
mammograms were obtained. Bilateral screening digital breast
tomosynthesis was performed. The images were evaluated with
computer-aided detection.

[R MLO synth-2D]
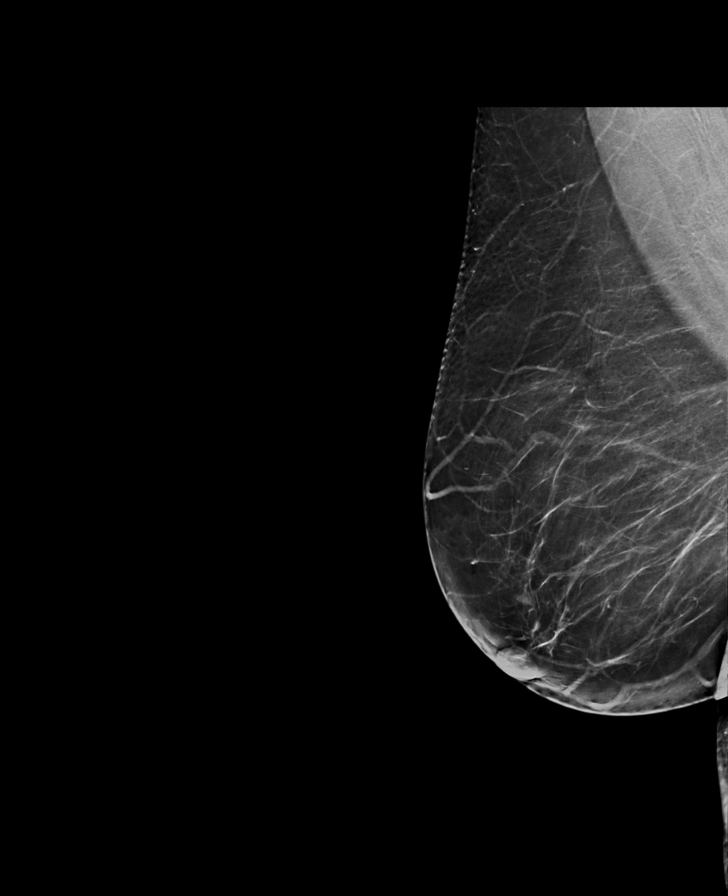

[L CC synth-2D]
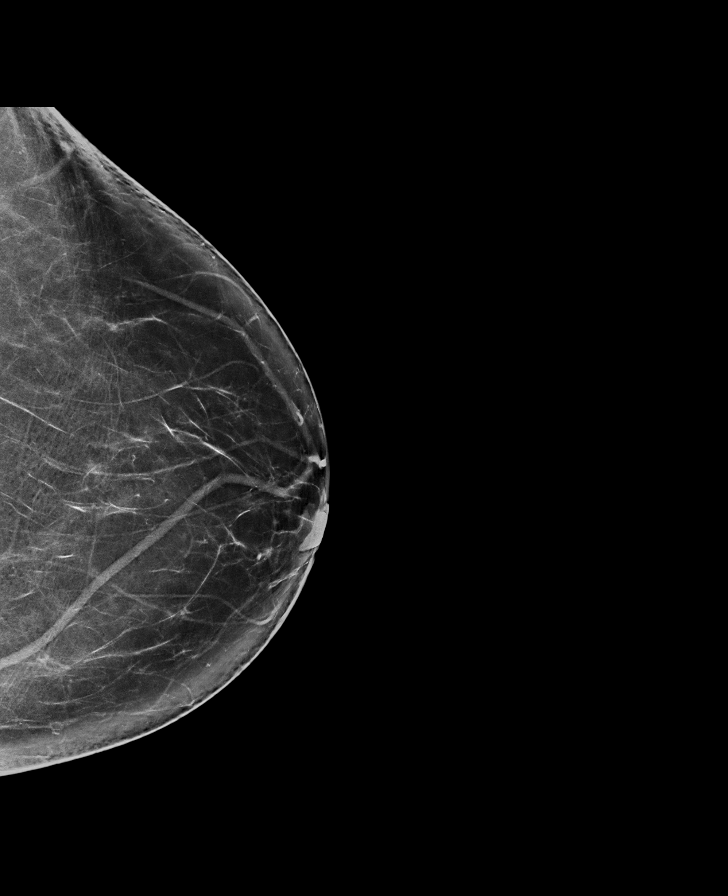

[R CC synth-2D]
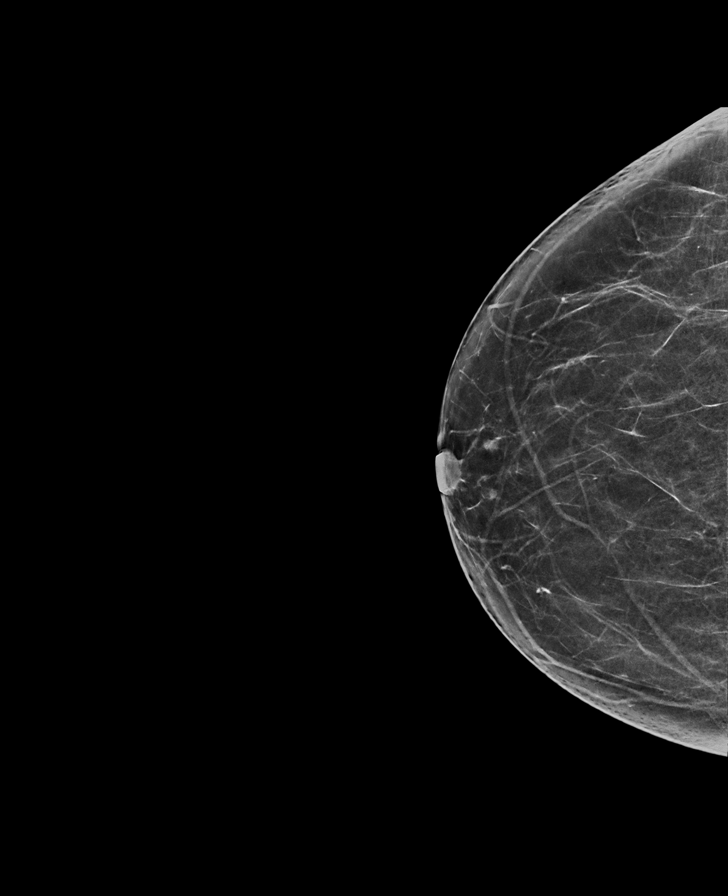

[L MLO synth-2D]
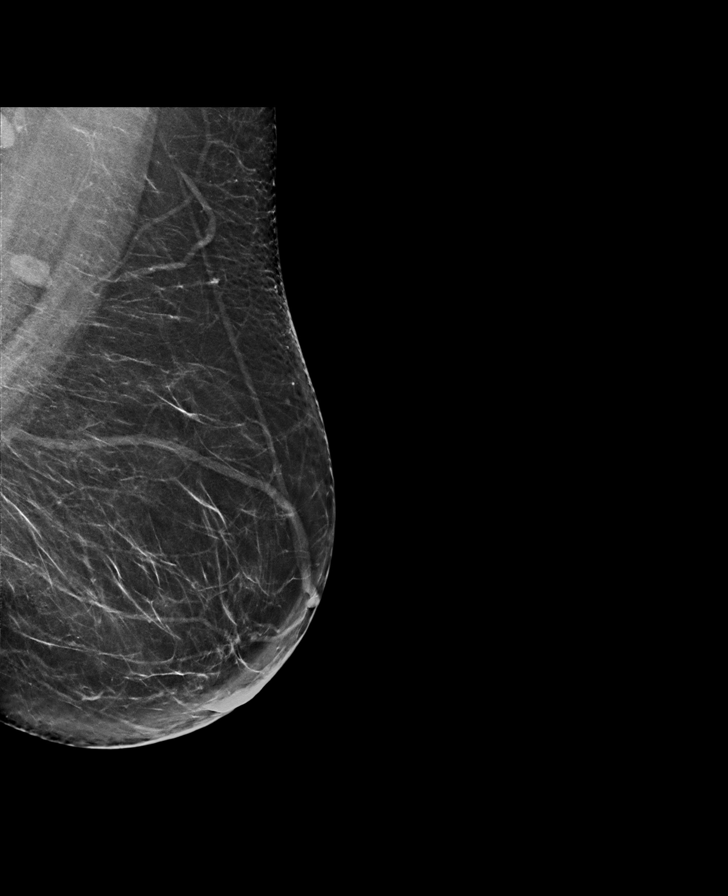

[L CC tomo · tomo slice 39/78.0]
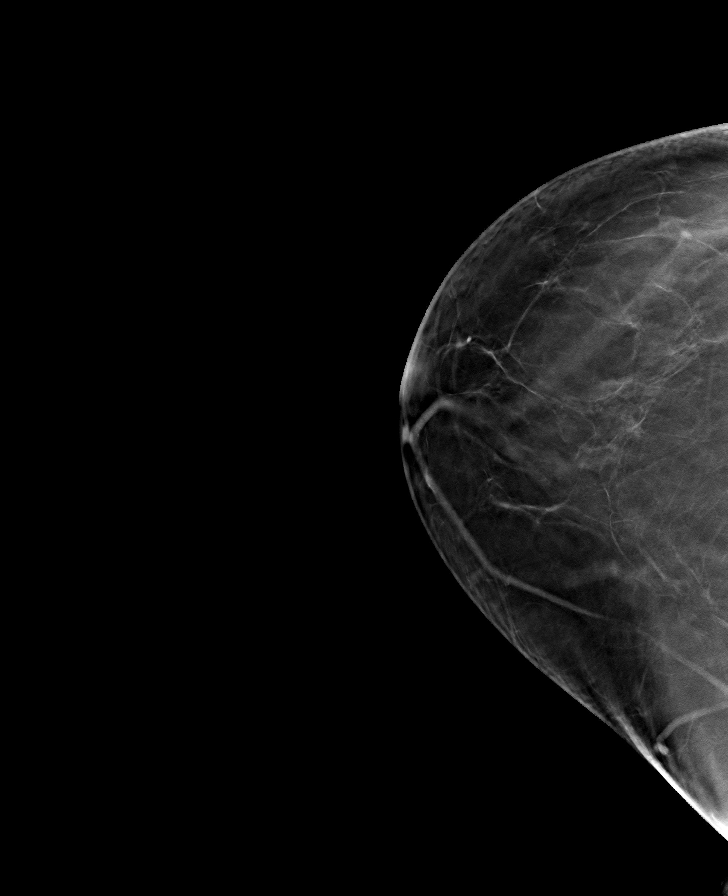

[R CC tomo · tomo slice 33/64.0]
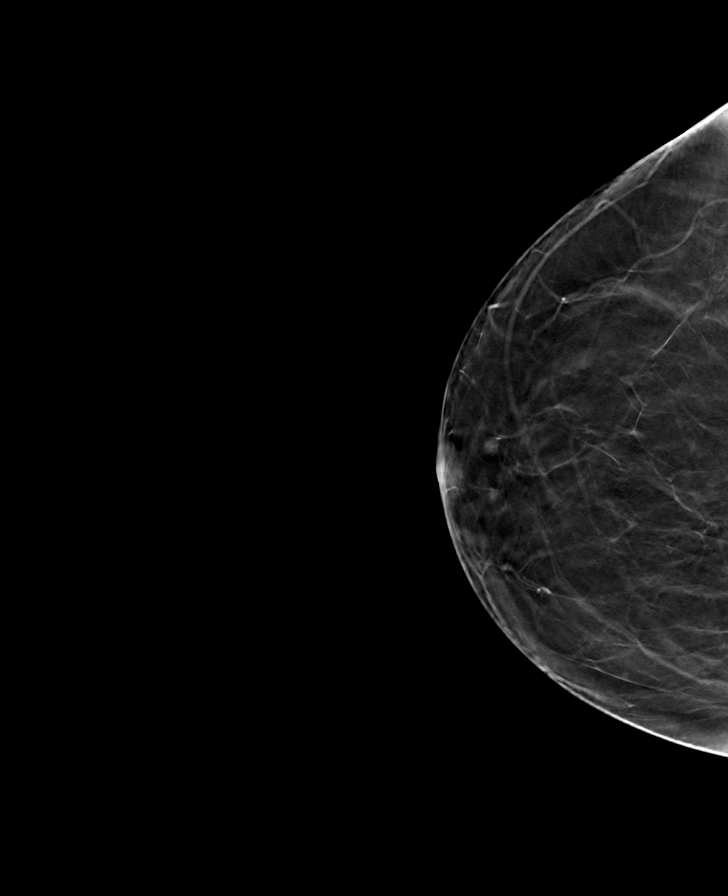

[L MLO tomo · tomo slice 41/82.0]
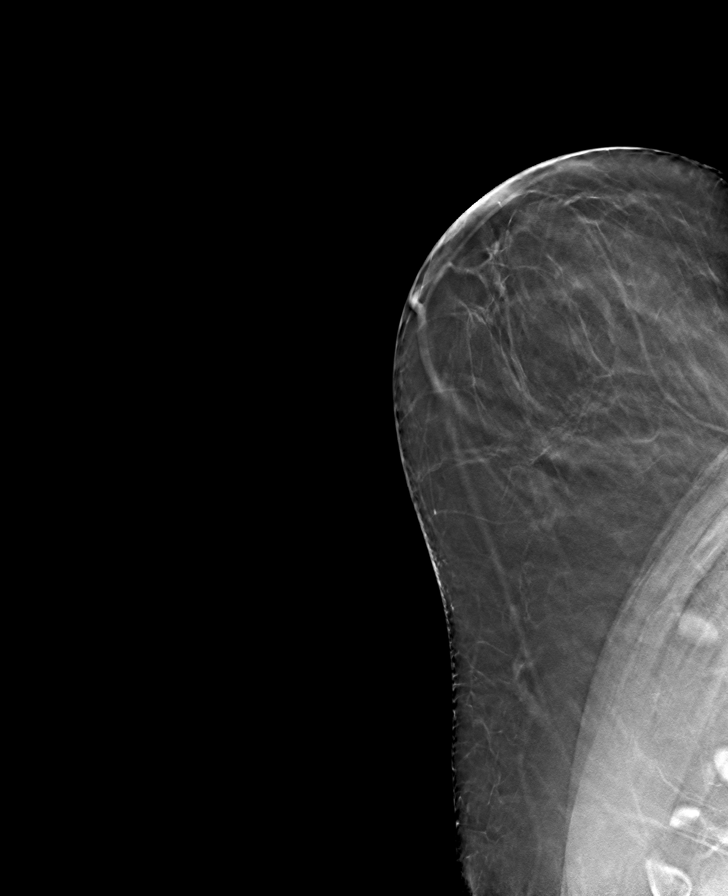

[R MLO tomo · tomo slice 41/82.0]
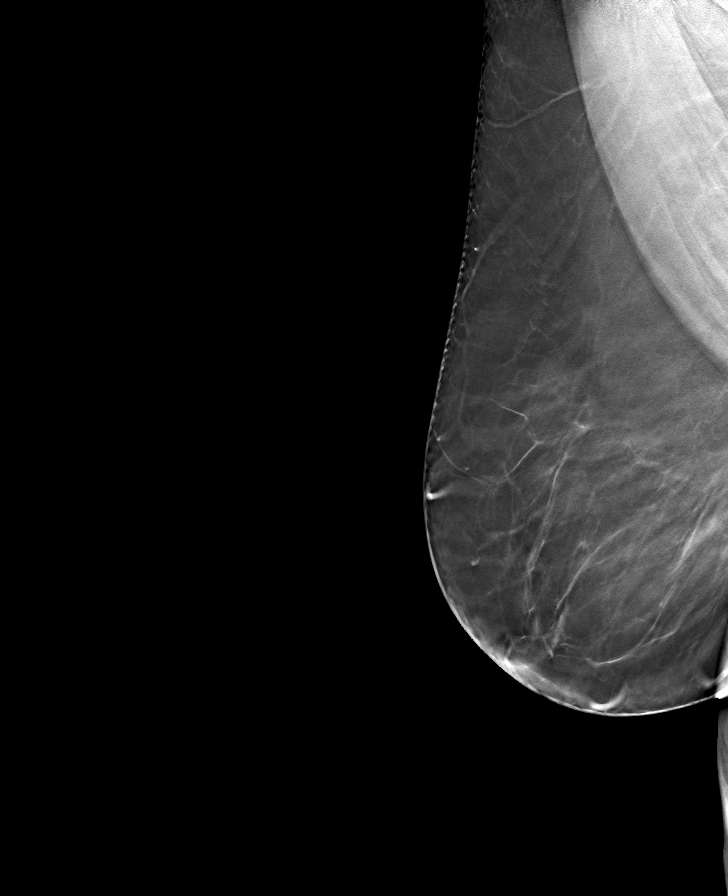

[8 of 24 positions shown; findings below may reference images not displayed]

FINDINGS: There are no findings suspicious for malignancy.
IMPRESSION: No mammographic evidence of malignancy. A result letter of this
screening mammogram will be mailed directly to the patient.

RECOMMENDATION:
Screening mammogram in one year. (Code:0E-3-N98)

BI-RADS CATEGORY  1: Negative.

## 2023-09-23 IMAGING — CR DG CHEST 2V
1 series · 2 of 2 positions shown · non-contrast
Comparison: 08/18/2015

CLINICAL DATA: 65-year-old female with chest pain.

EXAM:
CHEST - 2 VIEW

[Series 1: dg chest 2 view · 0.14mm/px · 2 of 2 slices shown]
[im 1/2]
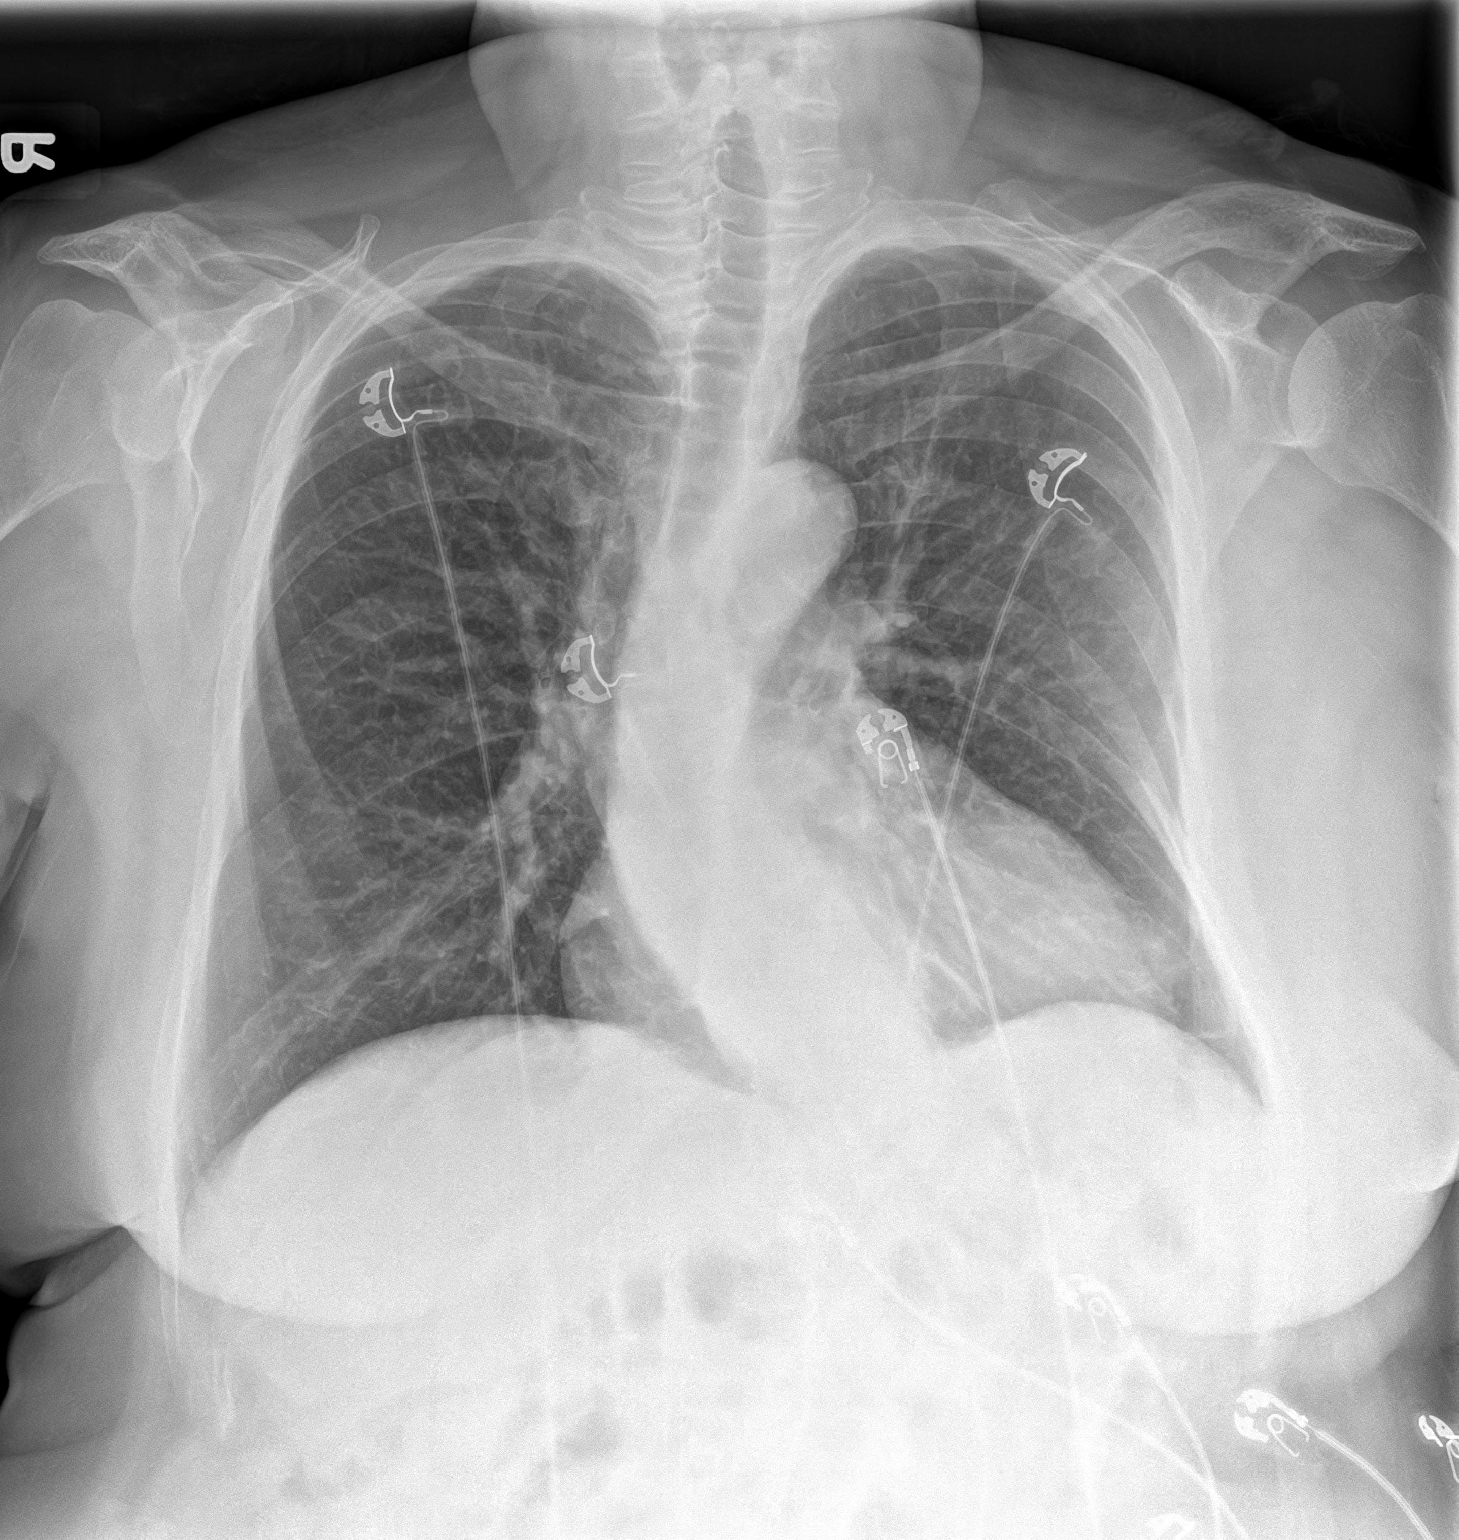
[im 2/2]
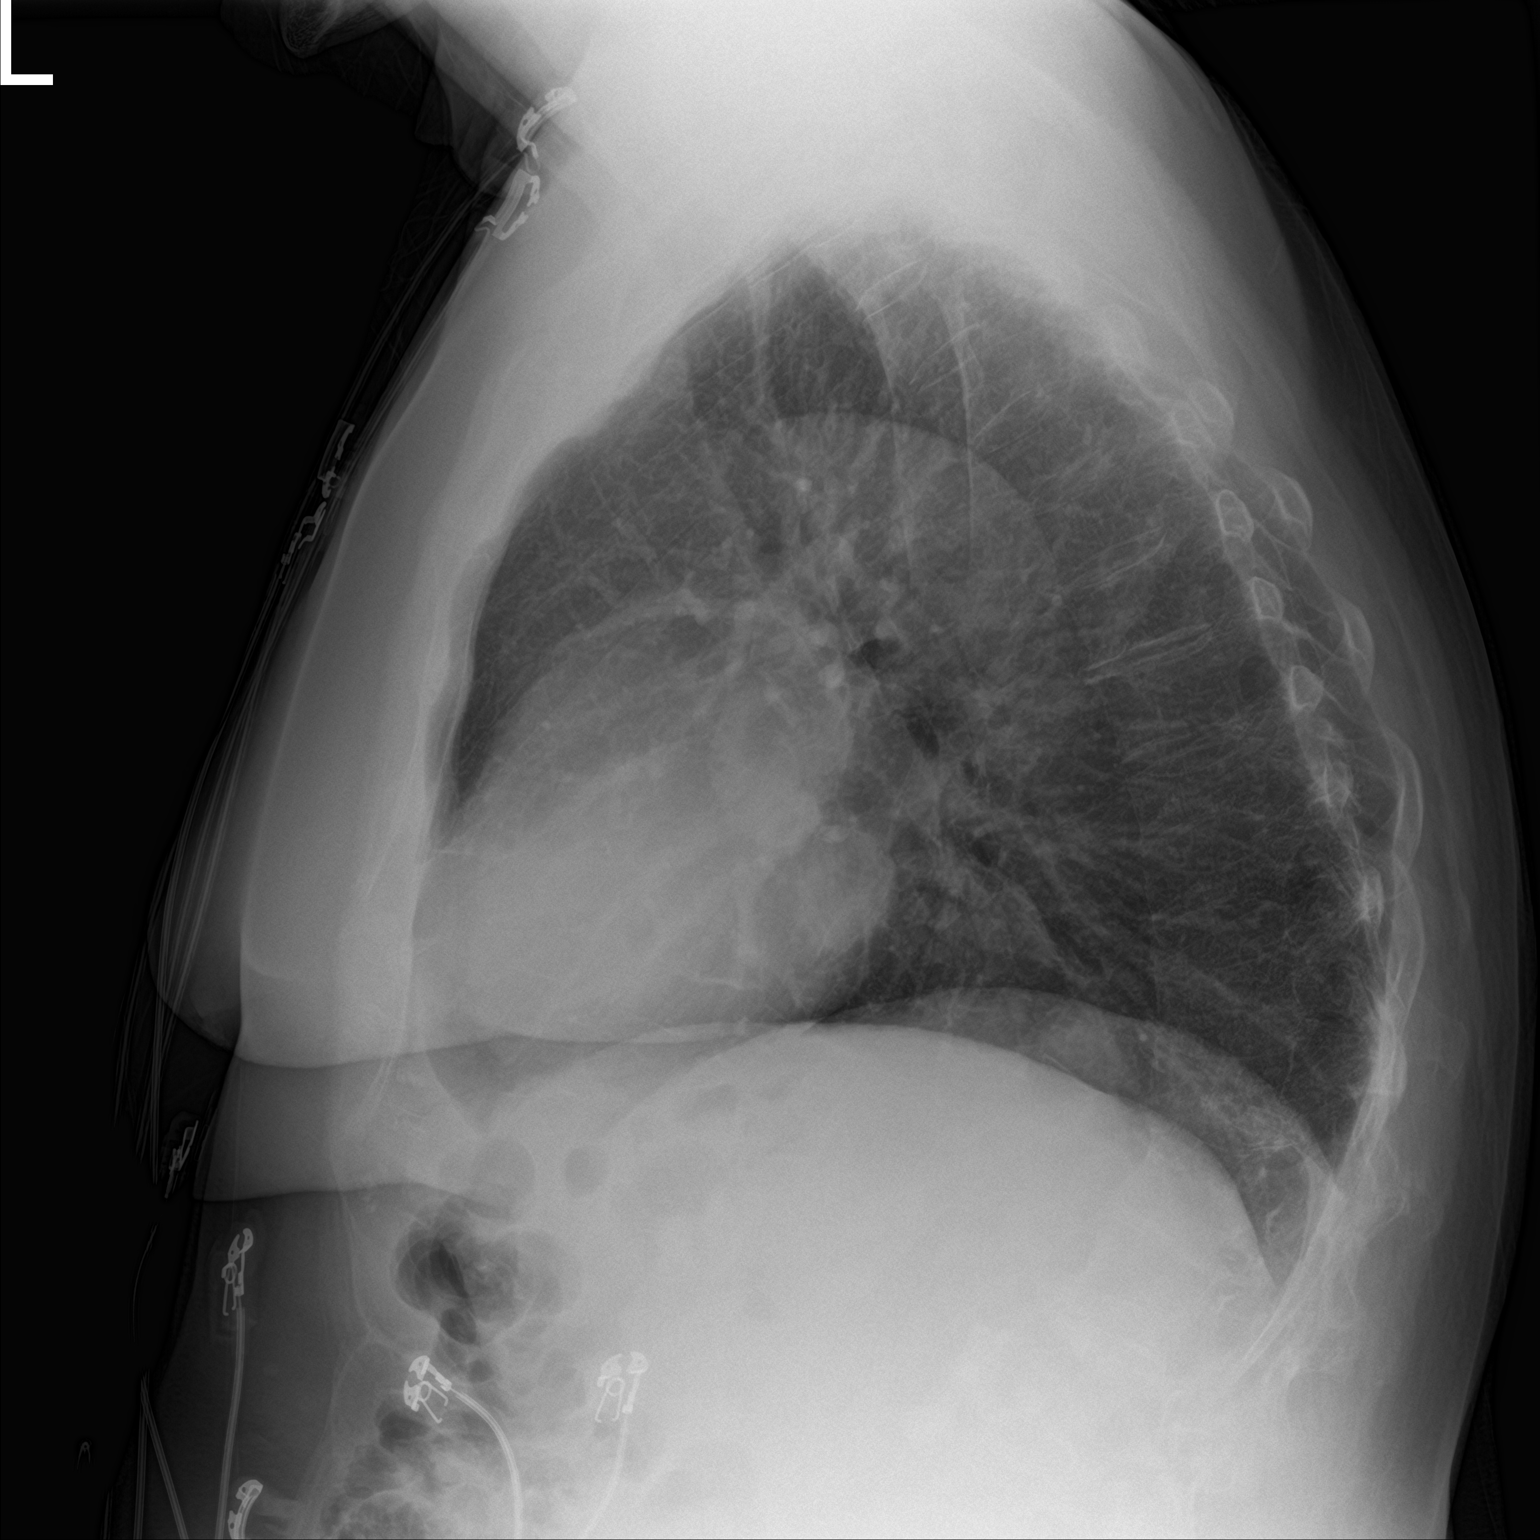

[2 of 2 positions shown; findings below may reference images not displayed]

FINDINGS: The mediastinal contours are within normal limits. No cardiomegaly.
The lungs are clear bilaterally without evidence of focal
consolidation, pleural effusion, or pneumothorax. No acute osseous
abnormality. Similar appearing sigmoid curvature of the
thoracolumbar spine.
IMPRESSION: No acute cardiopulmonary process.

## 2023-11-23 ENCOUNTER — Encounter: Payer: Self-pay | Admitting: Urology

## 2023-12-12 ENCOUNTER — Ambulatory Visit: Payer: Self-pay | Admitting: Urology

## 2023-12-13 ENCOUNTER — Ambulatory Visit
Admission: RE | Admit: 2023-12-13 | Discharge: 2023-12-13 | Disposition: A | Discharge status: Home / Self Care | Attending: Urology | Admitting: Urology

## 2023-12-13 ENCOUNTER — Ambulatory Visit
Admission: RE | Admit: 2023-12-13 | Discharge: 2023-12-13 | Disposition: A | Discharge status: Home / Self Care | Source: Ambulatory Visit | Attending: Urology

## 2023-12-13 DIAGNOSIS — N200 Calculus of kidney: Secondary | ICD-10-CM

## 2023-12-18 ENCOUNTER — Encounter: Payer: Self-pay | Admitting: Urology

## 2023-12-18 ENCOUNTER — Ambulatory Visit (INDEPENDENT_AMBULATORY_CARE_PROVIDER_SITE_OTHER): Admitting: Urology

## 2023-12-18 VITALS — BP 149/77 | HR 67 | Ht 67.0 in | Wt 200.0 lb

## 2023-12-18 DIAGNOSIS — N200 Calculus of kidney: Secondary | ICD-10-CM | POA: Diagnosis not present

## 2023-12-18 NOTE — Patient Instructions (Signed)
 Please call  Scheduling at [phone], to set up appointment to have your KUB done.

## 2023-12-18 NOTE — Progress Notes (Signed)
 12/18/2023 9:50 AM   Stephanie Cooper July 01, 1955 [id number]  Referring provider: Fernande Ophelia JINNY DOUGLAS, MD [address] [facility] [place],  [place] [address]  Urological history: 1.  Nephrolithiasis - stone composition  70% calcium  oxalate monohydrate, 20% calcium  oxalate dihydrate, and 10% calcium  apatite.  - History of left staghorn kidney stone - Left URS (2023)    Chief Complaint  Patient presents with   Follow-up   Nephrolithiasis   HPI: Stephanie Cooper is a 68 y.o. woman who presents today for yearly follow up.   Previous records reviewed.  She has had an uneventful year and relations to her kidney stone issues.  She denies any episodes of renal colic or passage of fragments.  She drinks plenty of water, minimal tea and minimal soda.  She is also using lemonade powder for citric acid intake.  Patient denies any modifying or aggravating factors.  Patient denies any recent UTI's, gross hematuria, dysuria or suprapubic/flank pain.  Patient denies any fevers, chills, nausea or vomiting.    KUB shows stable stone burden in the left mid and lower pole.  Awaiting radiologic interpretation.  Serum creatinine 0.8, eGFR of 79   PMH: Past Medical History:  Diagnosis Date   Anemia    Chronic diastolic (congestive) heart failure (HCC) 08/18/2015   a.) TTE 08/18/2015: EF 65-70%, mild LVH, LAE, triv MR, triv TR; b.) TTE 06/24/2018: EF >55%, mod LVH, mild BAE, triv AR/PR, mod MR/TR, G1DD; c.) TTE 08/16/2020: EF >55%, mod LVH, mild TR, mod MR; d.) TTE 03/28/2021: EF 70-75%, mod SAM, LAE, mod MR, G2DD   Complication of anesthesia    Family history of adverse reaction to anesthesia    a.) PONV in 1st degree relative (daughter)   GERD (gastroesophageal reflux disease)    History of hiatal hernia    History of kidney stones    Hypertension    Hypertrophic obstructive cardiomyopathy (HCC)    a.) no pathogenic variants; b.) NYHA class I-II symptoms with severe resting  gradients (100-122 mmHg).   NSTEMI (non-ST elevated myocardial infarction) (HCC) 03/27/2021   a.) troponins trended: 36 --> 89 --> 199 --> 231 --> 212; b.) LHC 03/28/2021: EF 75%, mildly elevated LVEDP, mod MR, normal coronaries/no CAD   Paroxysmal atrial fibrillation (HCC)    a.) CHA2DS2VASc = 4 (age, sex, CHF, HTN);  b.) rate/rhythm maintained on oral metoprolol  succinate; chronically anticoagulated with apixaban    PONV (postoperative nausea and vomiting)    Scoliosis    Vertigo 2012   X1    Surgical History: Past Surgical History:  Procedure Laterality Date   COLONOSCOPY     CYSTOSCOPY/URETEROSCOPY/HOLMIUM LASER/STENT PLACEMENT Left 03/27/2022   Procedure: CYSTOSCOPY/URETEROSCOPY/HOLMIUM LASER/STENT PLACEMENT;  Surgeon: Penne Knee, MD;  Location: [facility];  Service: Urology;  Laterality: Left;   CYSTOSCOPY/URETEROSCOPY/HOLMIUM LASER/STENT PLACEMENT Left 04/24/2022   Procedure: CYSTOSCOPY/URETEROSCOPY/HOLMIUM LASER/STENT EXCHANGE;  Surgeon: Penne Knee, MD;  Location: [facility];  Service: Urology;  Laterality: Left;   DIAGNOSTIC LAPAROSCOPY  1990   DILATATION & CURETTAGE/HYSTEROSCOPY WITH MYOSURE N/A 12/05/2019   Procedure: FRACTIONAL DILATATION & CURETTAGE/HYSTEROSCOPY WITH MYOSURE;  Surgeon: Schermerhorn, Debby JINNY, MD;  Location: [facility];  Service: Gynecology;  Laterality: N/A;   LEFT HEART CATH AND CORONARY ANGIOGRAPHY N/A 03/28/2021   Procedure: LEFT HEART CATH AND CORONARY ANGIOGRAPHY;  Surgeon: Hester Wolm JINNY, MD;  Location: [facility];  Service: Cardiovascular;  Laterality: N/A;   LITHOTRIPSY      Home Medications:  Allergies as  of 12/18/2023       Reactions   Lisinopril  Cough   Prednisone  Other (See Comments)   Elevated blood pressure Felt like weight on her chest        Medication List        Accurate as of December 18, 2023  9:50 AM. If you have any questions, ask your nurse or doctor.          apixaban  5 MG Tabs tablet Commonly known as:  ELIQUIS  Take 1 tablet (5 mg total) by mouth 2 (two) times daily.   famotidine  20 MG tablet Commonly known as: PEPCID  Take 20 mg by mouth 2 (two) times daily.   losartan  25 MG tablet Commonly known as: COZAAR  Take 25 mg by mouth every morning.   metoprolol  succinate 25 MG 24 hr tablet Commonly known as: TOPROL -XL Take 25 mg by mouth every morning.   montelukast  10 MG tablet Commonly known as: SINGULAIR  Take 10 mg by mouth every evening.   VITAMIN D2 PO Take 1 tablet by mouth daily at 6 (six) AM.        Allergies:  Allergies  Allergen Reactions   Lisinopril  Cough   Prednisone  Other (See Comments)    Elevated blood pressure Felt like weight on her chest    Family History: Family History  Problem Relation Age of Onset   Diverticulitis Mother    Atrial fibrillation Mother    Diabetes Father    Breast cancer Neg Hx     Social History: See HPI for pertinent social history  ROS: Pertinent ROS in HPI  Physical Exam: BP (!) 149/77 (BP Location: Left Arm, Patient Position: Sitting, Cuff Size: Normal)   Pulse 67   Ht 5' 7 (1.702 m)   Wt 200 lb (90.7 kg)   SpO2 95%   BMI 31.32 kg/m   Constitutional:  Well nourished. Alert and oriented, No acute distress. HEENT: [place] AT, moist mucus membranes.  Trachea midline Cardiovascular: No clubbing, cyanosis, or edema. Respiratory: Normal respiratory effort, no increased work of breathing. Neurologic: Grossly intact, no focal deficits, moving all 4 extremities. Psychiatric: Normal mood and affect.  Laboratory Data: See EPIC and HPI  I have reviewed the labs.   Pertinent Imaging: KUB I have independently reviewed the films.  See HPI.   Assessment & Plan:    1. Left staghorn stone - KUB with stable stone burden in the left kidney compared to last year's x-ray - Patient asymptomatic today - Encouraged to continue to drink fluid, mainly water, and lemonade powder for citric acid - Reviewed return to clinic signs,  such as flank pain, gross hematuria or fever/chills  Return in about 1 year (around 12/17/2024) for KUB, symptom check.  These notes generated with voice recognition software. I apologize for typographical errors.  CLOTILDA HELON RIGGERS  [facility] [address]  [address] [place], [place] [address] [phone]

## 2024-12-17 ENCOUNTER — Ambulatory Visit: Admitting: Physician Assistant
